# Patient Record
Sex: Female | Born: 1955 | Race: White | Hispanic: No | Smoking: Current every day smoker
Health system: Southern US, Community
[De-identification: ages and names within clinical notes are randomized; demographics above are authoritative.]

## PROBLEM LIST (undated history)

## (undated) DIAGNOSIS — K219 Gastro-esophageal reflux disease without esophagitis: Secondary | ICD-10-CM

## (undated) DIAGNOSIS — M1990 Unspecified osteoarthritis, unspecified site: Secondary | ICD-10-CM

---

## 2006-11-01 ENCOUNTER — Emergency Department: Payer: Self-pay | Admitting: Emergency Medicine

## 2014-12-02 HISTORY — PX: EYE SURGERY: SHX253

## 2020-03-20 LAB — HM COLONOSCOPY

## 2020-04-18 LAB — HM MAMMOGRAPHY

## 2021-05-17 ENCOUNTER — Ambulatory Visit
Admission: EM | Admit: 2021-05-17 | Discharge: 2021-05-17 | Disposition: A | Discharge status: Home / Self Care | Payer: Medicare Other | Attending: Internal Medicine | Admitting: Internal Medicine

## 2021-05-17 ENCOUNTER — Encounter: Payer: Self-pay | Admitting: Emergency Medicine

## 2021-05-17 ENCOUNTER — Other Ambulatory Visit: Payer: Self-pay

## 2021-05-17 DIAGNOSIS — R351 Nocturia: Secondary | ICD-10-CM | POA: Diagnosis not present

## 2021-05-17 DIAGNOSIS — R6889 Other general symptoms and signs: Secondary | ICD-10-CM | POA: Insufficient documentation

## 2021-05-17 HISTORY — DX: Unspecified osteoarthritis, unspecified site: M19.90

## 2021-05-17 LAB — POCT URINALYSIS DIP (MANUAL ENTRY)
Bilirubin, UA: NEGATIVE
Glucose, UA: NEGATIVE mg/dL
Ketones, POC UA: NEGATIVE mg/dL
Nitrite, UA: NEGATIVE
Protein Ur, POC: NEGATIVE mg/dL
Spec Grav, UA: 1.02 (ref 1.010–1.025)
Urobilinogen, UA: 0.2 E.U./dL
pH, UA: 6 (ref 5.0–8.0)

## 2021-05-17 MED ORDER — NITROFURANTOIN MONOHYD MACRO 100 MG PO CAPS: 100.0000 mg | ORAL_CAPSULE | Freq: Two times a day (BID) | ORAL | 0 refills | Status: DC

## 2021-05-17 MED ORDER — OSELTAMIVIR PHOSPHATE 75 MG PO CAPS: 75.0000 mg | ORAL_CAPSULE | Freq: Two times a day (BID) | ORAL | 0 refills | Status: DC

## 2021-05-17 NOTE — ED Provider Notes (Signed)
Renaldo Fiddler    CSN: [id number] Arrival date & time: 05/17/21  0949      History   Chief Complaint Chief Complaint  Patient presents with   Facial Pain   Fever   Dysuria    HPI MILICENT ACHEAMPONG is a 65 y.o. female presents today with body aches, fever and sinus pressure on L face x 2 days.  She declines Covid testing. Was very stuffy and today is feeling better. She felt she had a high fever, but did not have a thermometer. Has not been sick at all in the past 3 weeks.  2- Urinary frequency qhs and would like be checked for UTI. Denies dysuria.     Past Medical History:  Diagnosis Date   Arthritis     There are no problems to display for this patient.   Past Surgical History:  Procedure Laterality Date   APPENDECTOMY     TUBAL LIGATION      OB History   No obstetric history on file.      Home Medications    Prior to Admission medications   Medication Sig Start Date End Date Taking? Authorizing Provider  ibuprofen (ADVIL) 400 MG tablet Take 400 mg by mouth every 6 (six) hours as needed.   Yes [provider]  nitrofurantoin, macrocrystal-monohydrate, (MACROBID) 100 MG capsule Take 1 capsule (100 mg total) by mouth 2 (two) times daily. 05/17/21  Yes Rodriguez-Southworth, Nettie Elm, PA-C  oseltamivir (TAMIFLU) 75 MG capsule Take 1 capsule (75 mg total) by mouth every 12 (twelve) hours. 05/17/21  Yes Rodriguez-Southworth, Viviana Simpler    Family History History reviewed. No pertinent family history.  Social History Social History   Tobacco Use   Smoking status: Some Days    Pack years: 0.00    Types: Cigarettes   Smokeless tobacco: Never  Substance Use Topics   Alcohol use: Yes    Comment: rarely   Drug use: Never     Allergies   Patient has no known allergies.   Review of Systems + for frequency, body aches, nose congestion, fever and body aches.    Physical Exam Triage Vital Signs ED Triage Vitals  Enc Vitals Group      BP 05/17/21 1004 (!) 141/95     Pulse Rate 05/17/21 1004 (!) 114     Resp 05/17/21 1004 18     Temp 05/17/21 1004 99.3 F (37.4 C)     Temp Source 05/17/21 1004 Oral     SpO2 05/17/21 1004 95 %     Weight 05/17/21 1007 200 lb (90.7 kg)     Height 05/17/21 1007 5\' 9"  (1.753 m)     Head Circumference --      Peak Flow --      Pain Score 05/17/21 1007 2     Pain Loc --      Pain Edu? --      Excl. in GC? --    No data found.  Updated Vital Signs BP (!) 141/95 (BP Location: Left Arm)   Pulse (!) 114   Temp 99.3 F (37.4 C) (Oral)   Resp 18   Ht 5\' 9"  (1.753 m)   Wt 200 lb (90.7 kg)   SpO2 95%   BMI 29.53 kg/m   Visual Acuity Right Eye Distance:   Left Eye Distance:   Bilateral Distance:    Right Eye Near:   Left Eye Near:    Bilateral Near:     Physical  Exam Physical Exam Vitals signs and nursing note reviewed.  Constitutional:      General: She is not in acute distress.    Appearance: Normal appearance. She is not ill-appearing, toxic-appearing or diaphoretic.  HENT:     Head: Normocephalic.     Right Ear: Tympanic membrane, ear canal and external ear normal.     Left Ear: Tympanic membrane, ear canal and external ear normal.     Nose: mild congestion    Mouth/Throat: clear    Mouth: Mucous membranes are moist.  Eyes:     General: No scleral icterus.       Right eye: No discharge.        Left eye: No discharge.     Conjunctiva/sclera: Conjunctivae normal.  Neck:     Musculoskeletal: Neck supple. No neck rigidity.  Cardiovascular:     Rate and Rhythm: Normal rate and regular rhythm.     Heart sounds: No murmur.  Pulmonary:     Effort: Pulmonary effort is normal.     Breath sounds: Normal breath sounds.  Abdominal:     General: Bowel sounds are normal. There is no distension.     Palpations: Abdomen is soft. There is no mass.     Tenderness: There is no abdominal tenderness. There is no guarding or rebound.     Hernia: No hernia is present. Neg CVA  tenderness Musculoskeletal: Normal range of motion.  Lymphadenopathy:     Cervical: No cervical adenopathy.  Skin:    General: Skin is warm and dry.     Coloration: Skin is not jaundiced.     Findings: No rash.  Neurological:     Mental Status: She is alert and oriented to person, place, and time.     Gait: Gait normal.  Psychiatric:        Mood and Affect: Mood normal.        Behavior: Behavior normal.        Thought Content: Thought content normal.        Judgment: Judgment normal.    UC Treatments / Results  Labs (all labs ordered are listed, but only abnormal results are displayed) Labs Reviewed  POCT URINALYSIS DIP (MANUAL ENTRY) - Abnormal; Notable for the following components:      Result Value   Blood, UA trace-intact (*)    Leukocytes, UA Small (1+) (*)    All other components within normal limits  URINE CULTURE    EKG   Radiology No results found.  Procedures Procedures (including critical care time)  Medications Ordered in UC Medications - No data to display  Initial Impression / Assessment and Plan / UC Course  I have reviewed the triage vital signs and the nursing notes. Pertinent labs  results that were available during my care of the patient were reviewed by me and considered in my medical decision making (see chart for details). Has flu like symptoms, may have covid but she declined testing for both. Did request antiviral so I prescribed her Tamiflu as noted.  May have early UTI and I also placed her on Macrobid. Urine culture was ordered. We will call her if we need to change her rx.  Final Clinical Impressions(s) / UC Diagnoses   Final diagnoses:  Nocturia  Flu-like symptoms     Discharge Instructions      Your urine results dont justify the fever you have been having. You may have Covid or flu. We will call you if the urine  culture shows we need to change your antibiotic. Continue the Ibuprofen for body aches, fever. You may do saline  rinses to flush out your sinuses and do Netie Pot rinses twice a day for 5-7 days  If your Covid test at home ends up positive( do it day 3-5 of onset of symptoms) you may take the following supplements to help your immune system be stronger to fight this viral infection Take Quarcetin 500 mg three times a day x 7 days with Zinc 50 mg ones a day x 7 days. The quarcetin is an antiviral and anti-inflammatory supplement which helps open the zinc channels in the cell to absorb Zinc. Zinc helps decrease the virus load in your body. Take Melatonin 6-10 mg at bed time which also helps support your immune system.  Also make sure to take Vit D 5,000 IU per day with a fatty meal and Vit C 5000 mg a day until you are completely better. To prevent viral illnesses your vitamin D should be between 60-80. Stay on Vitamin D 2,000  and C  1000 mg the rest of the season.  Don't lay around, keep active and walk as much as you are able to to prevent worsening of your symptoms.  Follow up with your family Dr next week.  If you get short of breath and you are able to check  your oxygen with a pulse oxygen meter, if it gets to 92% or less, you need to go to the hospital to be admitted. If you dont have one, come back here and we will assess you.       ED Prescriptions     Medication Sig Dispense Auth. Provider   oseltamivir (TAMIFLU) 75 MG capsule Take 1 capsule (75 mg total) by mouth every 12 (twelve) hours. 10 capsule Rodriguez-Southworth, Nettie Elm, PA-C   nitrofurantoin, macrocrystal-monohydrate, (MACROBID) 100 MG capsule Take 1 capsule (100 mg total) by mouth 2 (two) times daily. 10 capsule Rodriguez-Southworth, Nettie Elm, PA-C      PDMP not reviewed this encounter.   Garey Ham, PA-C 05/17/21 1343

## 2021-05-17 NOTE — Discharge Instructions (Addendum)
Your urine results dont justify the fever you have been having. You may have Covid or flu. We will call you if the urine culture shows we need to change your antibiotic. Continue the Ibuprofen for body aches, fever. You may do saline rinses to flush out your sinuses and do Netie Pot rinses twice a day for 5-7 days  If your Covid test at home ends up positive( do it day 3-5 of onset of symptoms) you may take the following supplements to help your immune system be stronger to fight this viral infection Take Quarcetin 500 mg three times a day x 7 days with Zinc 50 mg ones a day x 7 days. The quarcetin is an antiviral and anti-inflammatory supplement which helps open the zinc channels in the cell to absorb Zinc. Zinc helps decrease the virus load in your body. Take Melatonin 6-10 mg at bed time which also helps support your immune system.  Also make sure to take Vit D 5,000 IU per day with a fatty meal and Vit C 5000 mg a day until you are completely better. To prevent viral illnesses your vitamin D should be between 60-80. Stay on Vitamin D 2,000  and C  1000 mg the rest of the season.  Don't lay around, keep active and walk as much as you are able to to prevent worsening of your symptoms.  Follow up with your family Dr next week.  If you get short of breath and you are able to check  your oxygen with a pulse oxygen meter, if it gets to 92% or less, you need to go to the hospital to be admitted. If you dont have one, come back here and we will assess you.

## 2021-05-17 NOTE — ED Triage Notes (Addendum)
Pt presents today with c/o of body aches, fever and sinus pressure x 2 days. She also reports urine frequency during the night. She would like to also be tested for UTI. Denies n/v/d. Pt declines to be test for Covid.

## 2021-05-18 LAB — URINE CULTURE: Culture: NO GROWTH

## 2021-06-29 ENCOUNTER — Encounter: Payer: Self-pay | Admitting: Family Medicine

## 2021-06-29 ENCOUNTER — Other Ambulatory Visit: Payer: Self-pay | Admitting: Family Medicine

## 2021-06-29 ENCOUNTER — Other Ambulatory Visit: Payer: Self-pay

## 2021-06-29 ENCOUNTER — Ambulatory Visit (INDEPENDENT_AMBULATORY_CARE_PROVIDER_SITE_OTHER): Payer: Medicare Other | Admitting: Family Medicine

## 2021-06-29 VITALS — BP 145/88 | HR 87 | Ht 69.0 in | Wt 209.0 lb

## 2021-06-29 DIAGNOSIS — M159 Polyosteoarthritis, unspecified: Secondary | ICD-10-CM

## 2021-06-29 DIAGNOSIS — M545 Low back pain, unspecified: Secondary | ICD-10-CM | POA: Diagnosis not present

## 2021-06-29 DIAGNOSIS — M8949 Other hypertrophic osteoarthropathy, multiple sites: Secondary | ICD-10-CM

## 2021-06-29 DIAGNOSIS — R7309 Other abnormal glucose: Secondary | ICD-10-CM

## 2021-06-29 DIAGNOSIS — E538 Deficiency of other specified B group vitamins: Secondary | ICD-10-CM

## 2021-06-29 DIAGNOSIS — M150 Primary generalized (osteo)arthritis: Secondary | ICD-10-CM

## 2021-06-29 DIAGNOSIS — I10 Essential (primary) hypertension: Secondary | ICD-10-CM

## 2021-06-29 DIAGNOSIS — Z7689 Persons encountering health services in other specified circumstances: Secondary | ICD-10-CM | POA: Diagnosis not present

## 2021-06-29 DIAGNOSIS — M25552 Pain in left hip: Secondary | ICD-10-CM

## 2021-06-29 DIAGNOSIS — G8929 Other chronic pain: Secondary | ICD-10-CM

## 2021-06-29 DIAGNOSIS — E7889 Other lipoprotein metabolism disorders: Secondary | ICD-10-CM

## 2021-06-29 MED ORDER — BACLOFEN 10 MG PO TABS: 5.0000 mg | ORAL_TABLET | Freq: Three times a day (TID) | ORAL | 2 refills | Status: DC | PRN

## 2021-06-29 NOTE — Patient Instructions (Addendum)
Thank you for coming to the office today.  Recommend to start taking Tylenol Extra Strength 500mg  tabs - take 1 to 2 tabs per dose (max 1000mg ) every 6-8 hours for pain, max 24 hour daily dose is 6 tablets or 3000mg . In the future you can repeat the same everyday Tylenol course for 1-2 weeks at a time.   START anti inflammatory topical - OTC Voltaren (generic Diclofenac) topical 2-4 times a day as needed for pain swelling of affected joint for 1-2 weeks or longer.  Start taking Baclofen (Lioresal) 10mg  (muscle relaxant) - start with half (cut) to one whole pill at night as needed for next 1-3 nights (may make you drowsy, caution with driving) see how it affects you, then if tolerated increase to one pill 2 to 3 times a day or (every 8 hours as needed)  Future X-rays in the system, first come first serve, walk in Mon-Thurs, not during 12-1pm.   Please schedule a Follow-up Appointment to: Return in about 4 weeks (around 07/27/2021) for 4 weeks fasting lab only then 1 week later follow-up Arthritis / Labs / Vitamin B.  If you have any other questions or concerns, please feel free to call the office or send a message through MyChart. You may also schedule an earlier appointment if necessary.  Additionally, you may be receiving a survey about your experience at our office within a few days to 1 week by e-mail or mail. We value your feedback.  , DO [facility], 

## 2021-06-29 NOTE — Progress Notes (Signed)
Subjective:    Patient ID: Courtney Cervantes, female    DOB: October 15, 1956, 65 y.o.   MRN: [id number]  Courtney Cervantes is a 65 y.o. female presenting on 06/29/2021 for Establish Care and Hip Pain  Establish care, relocated to [place] years ago. Previous Dr Reva Bores  HPI  Osteoarthritis multiple joints Bilateral Hips, Low Back Pain Chronic L Hip Pain She has episodic flares with hip pain, worse with pain and stiffness, worse after prolonged sitting Impacting her daily function, she is self aware of this and impacting her in multiple ways. She is not walking regularly anymore. Admits gained some more weight as a result.  Tried Meloxicam without success. Tried intermittent Tylenol, Aleve, Motrin. Had X-rays but unsure when they were, done prior in [place] She has done Physical Therapy in past.  Question IT Band symptoms. She has a lot of stiffness, difficulty moving hip   History of B12 Deficiency - has resolved. Requesting repeat lab in future    Health Maintenance:  Colonoscopy done in [place], no polyps, diverticulosis.  Depression screen [facility] 2/9 06/29/2021  Decreased Interest 0  Down, Depressed, Hopeless 0  PHQ - 2 Score 0  Altered sleeping 3  Tired, decreased energy 0  Change in appetite 0  Feeling bad or failure about yourself  0  Trouble concentrating 0  Moving slowly or fidgety/restless 0  Suicidal thoughts 0  PHQ-9 Score 3  Difficult doing work/chores Very difficult    Past Medical History:  Diagnosis Date   Arthritis    Past Surgical History:  Procedure Laterality Date   APPENDECTOMY     TUBAL LIGATION     Social History   Socioeconomic History   Marital status: Widowed    Spouse name: Not on file   Number of children: Not on file   Years of education: Not on file   Highest education level: Not on file  Occupational History   Not on file  Tobacco Use   Smoking status: Every Day    Types: Cigarettes   Smokeless tobacco: Never   Substance and Sexual Activity   Alcohol use: Not Currently    Comment: rarely   Drug use: Never   Sexual activity: Not on file  Other Topics Concern   Not on file  Social History Narrative   Not on file   Social Determinants of Health   Financial Resource Strain: Not on file  Food Insecurity: Not on file  Transportation Needs: Not on file  Physical Activity: Not on file  Stress: Not on file  Social Connections: Not on file  Intimate Partner Violence: Not on file   History reviewed. No pertinent family history. Current Outpatient Medications on File Prior to Visit  Medication Sig   ibuprofen (ADVIL) 400 MG tablet Take 400 mg by mouth every 6 (six) hours as needed.   No current facility-administered medications on file prior to visit.    Review of Systems Per HPI unless specifically indicated above      Objective:    BP (!) 145/88   Pulse 87   Ht 5\' 9"  (1.753 m)   Wt 209 lb (94.8 kg)   SpO2 98%   BMI 30.86 kg/m   Wt Readings from Last 3 Encounters:  06/29/21 209 lb (94.8 kg)  05/17/21 200 lb (90.7 kg)    Physical Exam Vitals and nursing note reviewed.  Constitutional:      General: She is not in acute distress.    Appearance:  Normal appearance. She is well-developed. She is not diaphoretic.     Comments: Well-appearing, comfortable, cooperative  HENT:     Head: Normocephalic and atraumatic.  Eyes:     General:        Right eye: No discharge.        Left eye: No discharge.     Conjunctiva/sclera: Conjunctivae normal.  Cardiovascular:     Rate and Rhythm: Normal rate.  Pulmonary:     Effort: Pulmonary effort is normal.  Musculoskeletal:     Comments: Low Back / L hip and Greater Trochanter Inspection: BACK - Normal appearance, no spinal deformity, symmetrical. HIP - Normal appearance, symmetrical, no obvious leg length or pelvis deformity  Palpation: BACK - No tenderness over spinous processes. Bilateral lumbar paraspinal muscles non-tender and  without hypertonicity/spasm. Some mild discomfort over R iliac crest region of hip. HIP - Mild tender to palpation deeper L greater trochanter region of lateral upper thigh. Lower extremity thigh calf soft non tender no spasm.  ROM: BACK - Full active ROM forward flex / back extension, rotation L/R without discomfort HIP - LIMITED L hip range of motion with extension, has intact flexion from seated or supine. Reduced ROM internal and ext rot  Special Testing: BACK - Seated SLR negative for radicular pain bilaterally HIP - tolerates FABER FADIR but has reduced ROM and stiffness.  Strength: Bilateral hip flex/ext 5/5, knee flex/ext 5/5, ankle dorsiflex/plantarflex 5/5 Neurovascular: intact distal sensation to light touch   Skin:    General: Skin is warm and dry.     Findings: No erythema or rash.  Neurological:     Mental Status: She is alert and oriented to person, place, and time.  Psychiatric:        Mood and Affect: Mood normal.        Behavior: Behavior normal.        Thought Content: Thought content normal.     Comments: Well groomed, good eye contact, normal speech and thoughts   Results for orders placed or performed during the hospital encounter of 05/17/21  Urine Culture   Specimen: Urine, Random  Result Value Ref Range   Specimen Description URINE, RANDOM    Special Requests NONE    Culture      NO GROWTH Performed at [facility], [address], [place], [place] [address]    Report Status 05/18/2021 FINAL   POCT urinalysis dipstick  Result Value Ref Range   Color, UA yellow yellow   Clarity, UA clear clear   Glucose, UA negative negative mg/dL   Bilirubin, UA negative negative   Ketones, POC UA negative negative mg/dL   Spec Grav, UA 3.428 7.681 - 1.025   Blood, UA trace-intact (A) negative   pH, UA 6.0 5.0 - 8.0   Protein Ur, POC negative negative mg/dL   Urobilinogen, UA 0.2 0.2 or 1.0 E.U./dL   Nitrite, UA Negative Negative   Leukocytes, UA Small  (1+) (A) Negative      Assessment & Plan:   Problem List Items Addressed This Visit     Primary osteoarthritis involving multiple joints - Primary   Relevant Medications   baclofen (LIORESAL) 10 MG tablet   Other Relevant Orders   DG HIP UNILAT W OR W/O PELVIS 2-3 VIEWS LEFT   DG Lumbar Spine Complete   Other Visit Diagnoses     Encounter to establish care with new doctor       Chronic left hip pain  Relevant Medications   baclofen (LIORESAL) 10 MG tablet   Other Relevant Orders   DG HIP UNILAT W OR W/O PELVIS 2-3 VIEWS LEFT   Chronic bilateral low back pain without sciatica       Relevant Medications   baclofen (LIORESAL) 10 MG tablet   Other Relevant Orders   DG Lumbar Spine Complete       Chronic gradual worsening problem with L Hip pain Also Chronic Low Back Pain 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 Limiting her function overall, mobility and other activities. - No radiation of pain or radicular symptoms - Inadequate conservative therapy   Plan: 1. Start with high dose Tylenol 1000mg  TID 2. Start muscle relaxant with Baclofen 10mg  tabs - take 5-10mg  up to BID PRN, titrate up as tolerated 3. Topical OTC Voltaren - Has tried oral NSAID but she declines these now, failed Meloxicam in past. 4. Encouraged use of heating pad 1-2x daily for now then PRN. 5. X-rays Hip and Lumbar spine in 1 week, review and further plan to refer to Sports Med Dr if persistent issue, she wishes to avoid surgery - and is interested in other management, may benefit from therapy regimen and MSK if indicated for IT Band.   Orders Placed This Encounter  Procedures   DG HIP UNILAT W OR W/O PELVIS 2-3 VIEWS LEFT    Standing Status:   Future    Standing Expiration Date:   06/29/2022    Order Specific Question:   Reason for Exam (SYMPTOM  OR DIAGNOSIS  REQUIRED)    Answer:   chronic Left hip pain, history of OA/DJD and question IT Band    Order Specific Question:   Preferred imaging location?    Answer:   [facility] [place]    Order Specific Question:   Radiology Contrast Protocol - do NOT remove file path    Answer:   [email or web address]   DG Lumbar Spine Complete    Standing Status:   Future    Standing Expiration Date:   06/29/2022    Order Specific Question:   Reason for Exam (SYMPTOM  OR DIAGNOSIS REQUIRED)    Answer:   chronic low back pain, history of OA/DJD, also L hip pain    Order Specific Question:   Preferred imaging location?    Answer:   [facility] [place]      Meds ordered this encounter  Medications   baclofen (LIORESAL) 10 MG tablet    Sig: Take 0.5-1 tablets (5-10 mg total) by mouth 3 (three) times daily as needed for muscle spasms.    Dispense:  30 each    Refill:  2      Follow up plan: Return in about 4 weeks (around 07/27/2021) for 4 weeks fasting lab only then 1 week later follow-up Arthritis / Labs / Vitamin B.  Lab orders Future labs 07/19/21 -  CMET, Lipid, A1c, CBC, Vitamin B12  07/29/2021, DO [facility] 06/29/2021, 3:21 PM

## 2021-07-02 ENCOUNTER — Ambulatory Visit
Admission: RE | Admit: 2021-07-02 | Discharge: 2021-07-02 | Disposition: A | Discharge status: Home / Self Care | Payer: Medicare Other | Attending: Family Medicine | Admitting: Family Medicine

## 2021-07-02 ENCOUNTER — Ambulatory Visit
Admission: RE | Admit: 2021-07-02 | Discharge: 2021-07-02 | Disposition: A | Discharge status: Home / Self Care | Payer: Medicare Other | Source: Ambulatory Visit | Attending: Family Medicine | Admitting: Family Medicine

## 2021-07-02 ENCOUNTER — Other Ambulatory Visit: Payer: Self-pay

## 2021-07-02 DIAGNOSIS — M159 Polyosteoarthritis, unspecified: Secondary | ICD-10-CM

## 2021-07-02 DIAGNOSIS — M545 Low back pain, unspecified: Secondary | ICD-10-CM | POA: Diagnosis present

## 2021-07-02 DIAGNOSIS — M25552 Pain in left hip: Secondary | ICD-10-CM | POA: Insufficient documentation

## 2021-07-02 DIAGNOSIS — M8949 Other hypertrophic osteoarthropathy, multiple sites: Secondary | ICD-10-CM

## 2021-07-02 DIAGNOSIS — G8929 Other chronic pain: Secondary | ICD-10-CM | POA: Diagnosis present

## 2021-07-12 ENCOUNTER — Encounter: Payer: Self-pay | Admitting: Family Medicine

## 2021-07-18 ENCOUNTER — Other Ambulatory Visit: Payer: Self-pay

## 2021-07-18 DIAGNOSIS — R7309 Other abnormal glucose: Secondary | ICD-10-CM

## 2021-07-18 DIAGNOSIS — E7889 Other lipoprotein metabolism disorders: Secondary | ICD-10-CM

## 2021-07-18 DIAGNOSIS — M8949 Other hypertrophic osteoarthropathy, multiple sites: Secondary | ICD-10-CM

## 2021-07-18 DIAGNOSIS — E538 Deficiency of other specified B group vitamins: Secondary | ICD-10-CM

## 2021-07-18 DIAGNOSIS — I10 Essential (primary) hypertension: Secondary | ICD-10-CM

## 2021-07-18 DIAGNOSIS — M159 Polyosteoarthritis, unspecified: Secondary | ICD-10-CM

## 2021-07-19 ENCOUNTER — Other Ambulatory Visit: Payer: Self-pay

## 2021-07-19 ENCOUNTER — Other Ambulatory Visit: Payer: Medicare Other

## 2021-07-20 LAB — CBC WITH DIFFERENTIAL/PLATELET
Absolute Monocytes: 403 cells/uL (ref 200–950)
Basophils Absolute: 50 cells/uL (ref 0–200)
Basophils Relative: 0.8 %
Eosinophils Absolute: 341 cells/uL (ref 15–500)
Eosinophils Relative: 5.5 %
HCT: 47.7 % — ABNORMAL HIGH (ref 35.0–45.0)
Hemoglobin: 16.2 g/dL — ABNORMAL HIGH (ref 11.7–15.5)
Lymphs Abs: 1600 cells/uL (ref 850–3900)
MCH: 32.2 pg (ref 27.0–33.0)
MCHC: 34 g/dL (ref 32.0–36.0)
MCV: 94.8 fL (ref 80.0–100.0)
MPV: 9.1 fL (ref 7.5–12.5)
Monocytes Relative: 6.5 %
Neutro Abs: 3807 cells/uL (ref 1500–7800)
Neutrophils Relative %: 61.4 %
Platelets: 236 10*3/uL (ref 140–400)
RBC: 5.03 10*6/uL (ref 3.80–5.10)
RDW: 12.9 % (ref 11.0–15.0)
Total Lymphocyte: 25.8 %
WBC: 6.2 10*3/uL (ref 3.8–10.8)

## 2021-07-20 LAB — LIPID PANEL
Cholesterol: 260 mg/dL — ABNORMAL HIGH (ref <200)
HDL: 40 mg/dL — ABNORMAL LOW (ref >=50)
LDL Cholesterol (Calc): 158 mg/dL (calc) — ABNORMAL HIGH
Non-HDL Cholesterol (Calc): 220 mg/dL (calc) — ABNORMAL HIGH (ref <130)
Total CHOL/HDL Ratio: 6.5 (calc) — ABNORMAL HIGH (ref <5.0)
Triglycerides: 367 mg/dL — ABNORMAL HIGH (ref <150)

## 2021-07-20 LAB — COMPLETE METABOLIC PANEL WITH GFR
AG Ratio: 1.9 (calc) (ref 1.0–2.5)
ALT: 34 U/L — ABNORMAL HIGH (ref 6–29)
AST: 27 U/L (ref 10–35)
Albumin: 4.2 g/dL (ref 3.6–5.1)
Alkaline phosphatase (APISO): 65 U/L (ref 37–153)
BUN: 14 mg/dL (ref 7–25)
CO2: 29 mmol/L (ref 20–32)
Calcium: 9.4 mg/dL (ref 8.6–10.4)
Chloride: 105 mmol/L (ref 98–110)
Creat: 0.88 mg/dL (ref 0.50–1.05)
Globulin: 2.2 g/dL (calc) (ref 1.9–3.7)
Glucose, Bld: 93 mg/dL (ref 65–139)
Potassium: 4.4 mmol/L (ref 3.5–5.3)
Sodium: 141 mmol/L (ref 135–146)
Total Bilirubin: 0.7 mg/dL (ref 0.2–1.2)
Total Protein: 6.4 g/dL (ref 6.1–8.1)
eGFR: 72 mL/min/{1.73_m2} (ref >=60)

## 2021-07-20 LAB — HEMOGLOBIN A1C
Hgb A1c MFr Bld: 4.8 % of total Hgb (ref <5.7)
Mean Plasma Glucose: 91 mg/dL
eAG (mmol/L): 5 mmol/L

## 2021-07-20 LAB — TSH: TSH: 4.26 mIU/L (ref 0.40–4.50)

## 2021-07-20 LAB — VITAMIN B12: Vitamin B-12: 220 pg/mL (ref 200–1100)

## 2021-07-26 ENCOUNTER — Ambulatory Visit (INDEPENDENT_AMBULATORY_CARE_PROVIDER_SITE_OTHER): Payer: Medicare Other | Admitting: Family Medicine

## 2021-07-26 ENCOUNTER — Encounter: Payer: Self-pay | Admitting: Family Medicine

## 2021-07-26 ENCOUNTER — Other Ambulatory Visit: Payer: Self-pay

## 2021-07-26 VITALS — BP 136/84 | HR 83 | Ht 69.0 in | Wt 211.2 lb

## 2021-07-26 DIAGNOSIS — E782 Mixed hyperlipidemia: Secondary | ICD-10-CM | POA: Diagnosis not present

## 2021-07-26 DIAGNOSIS — M8949 Other hypertrophic osteoarthropathy, multiple sites: Secondary | ICD-10-CM

## 2021-07-26 DIAGNOSIS — M150 Primary generalized (osteo)arthritis: Secondary | ICD-10-CM

## 2021-07-26 DIAGNOSIS — M159 Polyosteoarthritis, unspecified: Secondary | ICD-10-CM

## 2021-07-26 DIAGNOSIS — G8929 Other chronic pain: Secondary | ICD-10-CM

## 2021-07-26 DIAGNOSIS — I10 Essential (primary) hypertension: Secondary | ICD-10-CM | POA: Diagnosis not present

## 2021-07-26 DIAGNOSIS — M25552 Pain in left hip: Secondary | ICD-10-CM

## 2021-07-26 DIAGNOSIS — M545 Low back pain, unspecified: Secondary | ICD-10-CM

## 2021-07-26 NOTE — Progress Notes (Signed)
Subjective:    Patient ID: Courtney Cervantes, female    DOB: [birth year], 65 y.o.   MRN: [id number]  Courtney Cervantes is a 65 y.o. female presenting on 07/26/2021 for Arthritis   HPI  Left Hip Pain, Chronic  Osteoarthritis multiple joints Bilateral Hips, Low Back Pain She has episodic flares with hip pain, worse with pain and stiffness, worse after prolonged sitting Impacting her daily function, she is self aware of this and impacting her in multiple ways. She is not walking regularly anymore. Admits gained some more weight as a result.  Last visit 06/29/21 for this issue. Trial on Baclofen.   Tried Meloxicam without success. Tried intermittent Tylenol, Aleve, Motrin.  Reviewed last X-rays done recently 07/2021  She has done Physical Therapy in past.   Question IT Band symptoms. She has a lot of stiffness, difficulty moving hip   History of B12 Deficiency Last lab B12 220 result. She takes a Vitamin B Complex.  HYPERLIPIDEMIA: - Reports concerns. Last lipid panel 07/2021, abnormal  Tobacco Abuse Elevated Hemoglobin Recent stressors, she has been smoking regularly   Depression screen [facility] 2/9 06/29/2021  Decreased Interest 0  Down, Depressed, Hopeless 0  PHQ - 2 Score 0  Altered sleeping 3  Tired, decreased energy 0  Change in appetite 0  Feeling bad or failure about yourself  0  Trouble concentrating 0  Moving slowly or fidgety/restless 0  Suicidal thoughts 0  PHQ-9 Score 3  Difficult doing work/chores Very difficult    Social History   Tobacco Use   Smoking status: Every Day    Types: Cigarettes   Smokeless tobacco: Never  Substance Use Topics   Alcohol use: Not Currently    Comment: rarely   Drug use: Never    Review of Systems Per HPI unless specifically indicated above     Objective:    BP 136/84   Pulse 83   Ht '5\' 9"'  (1.753 m)   Wt 211 lb 3.2 oz (95.8 kg)   SpO2 98%   BMI 31.19 kg/m   Wt Readings from Last 3 Encounters:  07/26/21  211 lb 3.2 oz (95.8 kg)  06/29/21 209 lb (94.8 kg)  05/17/21 200 lb (90.7 kg)    Physical Exam Vitals and nursing note reviewed.  Constitutional:      General: She is not in acute distress.    Appearance: Normal appearance. She is well-developed. She is not diaphoretic.     Comments: Well-appearing, comfortable, cooperative  HENT:     Head: Normocephalic and atraumatic.  Eyes:     General:        Right eye: No discharge.        Left eye: No discharge.     Conjunctiva/sclera: Conjunctivae normal.  Cardiovascular:     Rate and Rhythm: Normal rate.  Pulmonary:     Effort: Pulmonary effort is normal.  Musculoskeletal:     Comments: Low Back / L hip and Greater Trochanter Inspection: BACK - Normal appearance, no spinal deformity, symmetrical. HIP - Normal appearance, symmetrical, no obvious leg length or pelvis deformity  Palpation: BACK - No tenderness over spinous processes. Bilateral lumbar paraspinal muscles non-tender and without hypertonicity/spasm. Some mild discomfort over R iliac crest region of hip. HIP - Mild tender to palpation deeper L greater trochanter region of lateral upper thigh. Lower extremity thigh calf soft non tender no spasm.  ROM: BACK - Full active ROM forward flex / back extension, rotation L/R without discomfort HIP -  LIMITED L hip range of motion with extension, has intact flexion from seated or supine. Reduced ROM internal and ext rot  Special Testing: BACK - Seated SLR negative for radicular pain bilaterally HIP - tolerates FABER FADIR but has reduced ROM and stiffness.  Strength: Bilateral hip flex/ext 5/5, knee flex/ext 5/5, ankle dorsiflex/plantarflex 5/5 Neurovascular: intact distal sensation to light touch   Skin:    General: Skin is warm and dry.     Findings: No erythema or rash.  Neurological:     Mental Status: She is alert and oriented to person, place, and time.  Psychiatric:        Mood and Affect: Mood normal.        Behavior:  Behavior normal.        Thought Content: Thought content normal.     Comments: Well groomed, good eye contact, normal speech and thoughts     Results for orders placed or performed in visit on 07/18/21  TSH  Result Value Ref Range   TSH 4.26 0.40 - 4.50 mIU/L  Vitamin B12  Result Value Ref Range   Vitamin B-12 220 200 - 1,100 pg/mL  Hemoglobin A1c  Result Value Ref Range   Hgb A1c MFr Bld 4.8 <5.7 % of total Hgb   Mean Plasma Glucose 91 mg/dL   eAG (mmol/L) 5.0 mmol/L  CBC with Differential/Platelet  Result Value Ref Range   WBC 6.2 3.8 - 10.8 Thousand/uL   RBC 5.03 3.80 - 5.10 Million/uL   Hemoglobin 16.2 (H) 11.7 - 15.5 g/dL   HCT 47.7 (H) 35.0 - 45.0 %   MCV 94.8 80.0 - 100.0 fL   MCH 32.2 27.0 - 33.0 pg   MCHC 34.0 32.0 - 36.0 g/dL   RDW 12.9 11.0 - 15.0 %   Platelets 236 140 - 400 Thousand/uL   MPV 9.1 7.5 - 12.5 fL   Neutro Abs 3,807 1,500 - 7,800 cells/uL   Lymphs Abs 1,600 850 - 3,900 cells/uL   Absolute Monocytes 403 200 - 950 cells/uL   Eosinophils Absolute 341 15 - 500 cells/uL   Basophils Absolute 50 0 - 200 cells/uL   Neutrophils Relative % 61.4 %   Total Lymphocyte 25.8 %   Monocytes Relative 6.5 %   Eosinophils Relative 5.5 %   Basophils Relative 0.8 %  Lipid panel  Result Value Ref Range   Cholesterol 260 (H) <200 mg/dL   HDL 40 (L) > OR = 50 mg/dL   Triglycerides 367 (H) <150 mg/dL   LDL Cholesterol (Calc) 158 (H) mg/dL (calc)   Total CHOL/HDL Ratio 6.5 (H) <5.0 (calc)   Non-HDL Cholesterol (Calc) 220 (H) <130 mg/dL (calc)  COMPLETE METABOLIC PANEL WITH GFR  Result Value Ref Range   Glucose, Bld 93 65 - 139 mg/dL   BUN 14 7 - 25 mg/dL   Creat 0.88 0.50 - 1.05 mg/dL   eGFR 72 > OR = 60 mL/min/1.32m   BUN/Creatinine Ratio NOT APPLICABLE 6 - 22 (calc)   Sodium 141 135 - 146 mmol/L   Potassium 4.4 3.5 - 5.3 mmol/L   Chloride 105 98 - 110 mmol/L   CO2 29 20 - 32 mmol/L   Calcium 9.4 8.6 - 10.4 mg/dL   Total Protein 6.4 6.1 - 8.1 g/dL   Albumin  4.2 3.6 - 5.1 g/dL   Globulin 2.2 1.9 - 3.7 g/dL (calc)   AG Ratio 1.9 1.0 - 2.5 (calc)   Total Bilirubin 0.7 0.2 - 1.2 mg/dL  Alkaline phosphatase (APISO) 65 37 - 153 U/L   AST 27 10 - 35 U/L   ALT 34 (H) 6 - 29 U/L   I have personally reviewed the radiology report from 07/02/21 on X-ray.  CLINICAL DATA:  Chronic low back and hip pain   EXAM: DG HIP (WITH OR WITHOUT PELVIS) 2-3V LEFT; LUMBAR SPINE - COMPLETE 4+ VIEW   COMPARISON:  X-ray lumbar spine dated November 01, 2006   FINDINGS: Lumbar spine: Five non-rib-bearing lumbar type vertebral bodies. Vertebral body heights are well maintained. No evidence of acute fracture. Normal alignment. Multilevel degenerative disc disease consisting of mild osteophyte formation and preserved disc space heights. Mild facet arthrosis, most pronounced in the lower lumbar spine. Soft tissues are unremarkable.   Left hip: No acute fracture or dislocation. Moderate degenerative changes of the left hip consisting of osteophyte formation, joint space loss, and subchondral sclerosis. Bilateral SI joints and pubic symphysis are unremarkable. Visualized sacrum is intact. Single AP view of the right hip demonstrates moderate degenerative changes and no acute osseous abnormality.   IMPRESSION: Mild spondylosis of the lumbar spine, increased compared to 2007 prior.   Moderate degenerative changes of the left hip.     Electronically Signed   By: Yetta Glassman MD   On: 07/03/2021 09:13  -----   CLINICAL DATA:  Chronic low back and hip pain   EXAM: DG HIP (WITH OR WITHOUT PELVIS) 2-3V LEFT; LUMBAR SPINE - COMPLETE 4+ VIEW   COMPARISON:  X-ray lumbar spine dated November 01, 2006   FINDINGS: Lumbar spine: Five non-rib-bearing lumbar type vertebral bodies. Vertebral body heights are well maintained. No evidence of acute fracture. Normal alignment. Multilevel degenerative disc disease consisting of mild osteophyte formation and preserved  disc space heights. Mild facet arthrosis, most pronounced in the lower lumbar spine. Soft tissues are unremarkable.   Left hip: No acute fracture or dislocation. Moderate degenerative changes of the left hip consisting of osteophyte formation, joint space loss, and subchondral sclerosis. Bilateral SI joints and pubic symphysis are unremarkable. Visualized sacrum is intact. Single AP view of the right hip demonstrates moderate degenerative changes and no acute osseous abnormality.   IMPRESSION: Mild spondylosis of the lumbar spine, increased compared to 2007 prior.   Moderate degenerative changes of the left hip.     Electronically Signed   By: Yetta Glassman MD   On: 07/03/2021 09:13     Assessment & Plan:   Problem List Items Addressed This Visit     Primary osteoarthritis involving multiple joints   Relevant Orders   Ambulatory referral to Sports Medicine   Mixed hyperlipidemia - Primary   Other Visit Diagnoses     Essential hypertension       Chronic left hip pain       Relevant Orders   Ambulatory referral to Sports Medicine   Chronic bilateral low back pain without sciatica       Relevant Orders   Ambulatory referral to Sports Medicine       Left Hip Chronic Pain Also assoc low back pain Osteoarthritis  Her symptoms are most likely a combination of factors, arthritis can certainly cause her symptoms and she very well may have more muscular and tendon issues.  Reviewed X-rays above Failed most conservative therapy options  chronic worsening L hip and low back pain, impacting function and mobility, no known injury  Now will proceed w/ referral to Sports Med for further evaluation for muscle/MSK etiology IT Band concern and  she prefers non surgical non orthopedic option.     Lab results  Check B12 amount, continue daily   Orders Placed This Encounter  Procedures   Ambulatory referral to Sports Medicine    Referral Priority:   Routine    Referral  Type:   Consultation    Number of Visits Requested:   1     No orders of the defined types were placed in this encounter.     Follow up plan: Return in about 6 months (around 01/26/2022) for 6 month follow-up Arthritis, Cholesterol AM fasting lab AFTER (if needed).   Nobie Putnam, [facility] 07/26/2021, 8:38 AM

## 2021-07-26 NOTE — Patient Instructions (Addendum)
Thank you for coming to the office today.  Red Rice Yeast supplement for cholesterol.  Lipid Panel     Component Value Date/Time   CHOL 260 (H) 07/19/2021 0814   TRIG 367 (H) 07/19/2021 0814   HDL 40 (L) 07/19/2021 0814   CHOLHDL 6.5 (H) 07/19/2021 0814   LDLCALC 158 (H) 07/19/2021 2130   Check your Vitamin for B12 - goal is 1000 mcg once daily.  Referral to Sports Med.  Joseph Berkshire, MD Address: [address], [place], [place] [address] Phone: [phone]  Recommend to start taking Tylenol Extra Strength 500mg  tabs - take 1 to 2 tabs per dose (max 1000mg ) every 6-8 hours for pain (take regularly, don't skip a dose for next 7 days), max 24 hour daily dose is 6 tablets or 3000mg . In the future you can repeat the same everyday Tylenol course for 1-2 weeks at a time.    Please schedule a Follow-up Appointment to: Return in about 6 months (around 01/26/2022) for 6 month follow-up Arthritis, Cholesterol AM fasting lab AFTER (if needed).  If you have any other questions or concerns, please feel free to call the office or send a message through MyChart. You may also schedule an earlier appointment if necessary.  Additionally, you may be receiving a survey about your experience at our office within a few days to 1 week by e-mail or mail. We value your feedback.  , DO [facility], 01/28/2022

## 2021-08-13 ENCOUNTER — Other Ambulatory Visit: Payer: Self-pay

## 2021-08-13 ENCOUNTER — Encounter: Payer: Self-pay | Admitting: Family Medicine

## 2021-08-13 ENCOUNTER — Ambulatory Visit (INDEPENDENT_AMBULATORY_CARE_PROVIDER_SITE_OTHER): Payer: Medicare Other | Admitting: Family Medicine

## 2021-08-13 VITALS — BP 122/72 | HR 87 | Temp 98.4°F | Ht 69.0 in | Wt 208.0 lb

## 2021-08-13 DIAGNOSIS — M1612 Unilateral primary osteoarthritis, left hip: Secondary | ICD-10-CM

## 2021-08-13 DIAGNOSIS — M47817 Spondylosis without myelopathy or radiculopathy, lumbosacral region: Secondary | ICD-10-CM | POA: Diagnosis not present

## 2021-08-13 DIAGNOSIS — R29898 Other symptoms and signs involving the musculoskeletal system: Secondary | ICD-10-CM | POA: Diagnosis not present

## 2021-08-13 MED ORDER — DULOXETINE HCL 30 MG PO CPEP: ORAL_CAPSULE | ORAL | 0 refills | Status: DC

## 2021-08-13 MED ORDER — CYCLOBENZAPRINE HCL 5 MG PO TABS: 5.0000 mg | ORAL_TABLET | Freq: Every evening | ORAL | 0 refills | Status: DC | PRN

## 2021-08-13 MED ORDER — DICLOFENAC SODIUM 50 MG PO TBEC: 50.0000 mg | DELAYED_RELEASE_TABLET | Freq: Two times a day (BID) | ORAL | 0 refills | Status: DC

## 2021-08-13 NOTE — Progress Notes (Signed)
Primary Care / Sports Medicine Office Visit  Patient Information:  Patient ID: Courtney Cervantes, female DOB: [birth year] Age: 65 y.o. MRN: [id number]   Courtney Cervantes is a pleasant 65 y.o. female presenting with the following:  Chief Complaint  Patient presents with   New Patient (Initial Visit)   Back Pain    Lumbar; x20 years; X-Ray 07/02/21; worse when bending down at the waist; 9/10 pain   Hip Pain    Left; x5 years; X-Ray 07/02/21; intermittent; worse with walking, aching, sore, sharp; was taking baclofen, but stopped because of side effects of sedation; 8/10 pain    Review of Systems pertinent details above   Patient Active Problem List   Diagnosis Date Noted   Primary osteoarthritis of left hip 08/13/2021   Muscular deconditioning 08/13/2021   Spondylosis of lumbosacral region without myelopathy or radiculopathy 08/13/2021   Mixed hyperlipidemia 07/26/2021   Primary osteoarthritis involving multiple joints 06/29/2021   Past Medical History:  Diagnosis Date   Arthritis    Outpatient Encounter Medications as of 08/13/2021  Medication Sig   acetaminophen (TYLENOL) 500 MG tablet Take 1,000 mg by mouth every 6 (six) hours as needed for moderate pain.   cyclobenzaprine (FLEXERIL) 5 MG tablet Take 1-2 tablets (5-10 mg total) by mouth at bedtime as needed for muscle spasms.   diclofenac (VOLTAREN) 50 MG EC tablet Take 1 tablet (50 mg total) by mouth 2 (two) times daily.   diphenhydrAMINE HCl (BENADRYL PO) Take by mouth at bedtime.   DULoxetine (CYMBALTA) 30 MG capsule Take 1 capsule (30 mg total) by mouth daily for 14 days, THEN 2 capsules (60 mg total) daily for 16 days.   MAGNESIUM OXIDE PO Take 1 tablet by mouth daily as needed.   naproxen sodium (ALEVE) 220 MG tablet Take 440 mg by mouth 2 (two) times daily as needed.   vitamin B-12 (CYANOCOBALAMIN) 1000 MCG tablet Take 1,000 mcg by mouth daily.   [DISCONTINUED] baclofen (LIORESAL) 10 MG tablet Take 0.5-1 tablets  (5-10 mg total) by mouth 3 (three) times daily as needed for muscle spasms. (Patient not taking: Reported on 08/13/2021)   [DISCONTINUED] ibuprofen (ADVIL) 400 MG tablet Take 400 mg by mouth every 6 (six) hours as needed.   No facility-administered encounter medications on file as of 08/13/2021.   Past Surgical History:  Procedure Laterality Date   APPENDECTOMY  1980   TUBAL LIGATION Bilateral 1994   UTERINE FIBROID SURGERY  1980    Vitals:   08/13/21 1319  BP: 122/72  Pulse: 87  Temp: 98.4 F (36.9 C)  SpO2: 95%   Vitals:   08/13/21 1319  Weight: 208 lb (94.3 kg)  Height: 5\' 9"  (1.753 m)   Body mass index is 30.72 kg/m.  No results found.   Independent interpretation of notes and tests performed by another provider:   Independent interpretation of lumbar x-ray films dated 07/02/2021 reveal overall maintained intervertebral space, anterior endplate osteophytes noted at L5, L4, L3 inferiorly, and L1, facet hypertrophy noted at the L4-5 articulation Independent to interpretation of left hip X-ray dated 07/02/2021 reveals moderate right and moderate to severe left hip osteoarthritis with subchondral sclerosis, deformation of the femoral head on the left, and acetabular osteophytes, no acute osseous process identified  Procedures performed:   None  Pertinent History, Exam, Impression, and Recommendations:   Primary osteoarthritis of left hip Patient with longstanding low back and left hip pain in the setting of polyarthropathy.  Her pain  is posterolateral left hip, low back symmetrically, denies radiation distally, no paresthesias, no weakness.  Examination shows significant deconditioning and tightness throughout the deep hip and pelvis with minimal adduction noted bilaterally, positive Ober's test, positive FADIR, FABER limited due to limitations in external rotation, negative straight leg raise test bilateral, tenderness at the SI joints symmetrically.  Her findings are most  consistent with underlying osteoarthritis related arthralgia of the left hip, multilevel spondyloarthropathy at the lower lumbar spine with focality at the L5-S1 and SI joints bilaterally.  These are all noted in the setting of significant tightness and deconditioning throughout the deep hip/core musculature.  I reviewed the same with the patient as well as treatment strategies moving forward.  Patient to start physical therapy, they will oversee a primarily home-based program given her stated motivation to do the same, duloxetine 30 mg x 2 weeks then 60 mg until follow-up, diclofenac, and cyclobenzaprine.  She is to return for follow-up in 4 weeks, if focal areas of symptomatology noted, local injections to be considered at the hip joint or periarticular musculature as an adjunct to physical therapy.  I have discussed that her goal be symptom control with long-term continued work towards addressing the deconditioning.   Muscular deconditioning See additional assessment(s) for plan details.  Spondylosis of lumbosacral region without myelopathy or radiculopathy See additional assessment(s) for plan details.    Orders & Medications Meds ordered this encounter  Medications   diclofenac (VOLTAREN) 50 MG EC tablet    Sig: Take 1 tablet (50 mg total) by mouth 2 (two) times daily.    Dispense:  60 tablet    Refill:  0   cyclobenzaprine (FLEXERIL) 5 MG tablet    Sig: Take 1-2 tablets (5-10 mg total) by mouth at bedtime as needed for muscle spasms.    Dispense:  60 tablet    Refill:  0   DULoxetine (CYMBALTA) 30 MG capsule    Sig: Take 1 capsule (30 mg total) by mouth daily for 14 days, THEN 2 capsules (60 mg total) daily for 16 days.    Dispense:  46 capsule    Refill:  0   Orders Placed This Encounter  Procedures   Ambulatory referral to Physical Therapy     Return in about 4 weeks (around 09/10/2021).     Jerrol Banana, MD   Primary Care Sports Medicine [facility]

## 2021-08-13 NOTE — Assessment & Plan Note (Signed)
Patient with longstanding low back and left hip pain in the setting of polyarthropathy.  Her pain is posterolateral left hip, low back symmetrically, denies radiation distally, no paresthesias, no weakness.  Examination shows significant deconditioning and tightness throughout the deep hip and pelvis with minimal adduction noted bilaterally, positive Ober's test, positive FADIR, FABER limited due to limitations in external rotation, negative straight leg raise test bilateral, tenderness at the SI joints symmetrically.  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  These are all noted in the setting of significant tightness and deconditioning throughout the deep hip/core musculature.  I reviewed the same with the patient as well as treatment strategies moving forward.  Patient to start physical therapy, they will oversee a primarily home-based program given her stated motivation to do the same, duloxetine 30 mg x 2 weeks then 60 mg until follow-up, diclofenac, and cyclobenzaprine.  She is to return for follow-up in 4 weeks, if focal areas of symptomatology noted, local injections to be considered at the hip joint or periarticular musculature as an adjunct to physical therapy.  I have discussed that her goal be symptom control with long-term continued work towards addressing the deconditioning.

## 2021-08-13 NOTE — Patient Instructions (Signed)
-   Referral coordinator will contact you for physical therapy scheduling - PT will teach you home exercises to perform daily - Stop baclofen - Start diclofenac every 12 hours with food (no other NSAIDs while on this medication, acetaminophen/Tylenol okay) - Start daily duloxetine (Cymbalta) - Can dose 1-2 tablet cyclobenzaprine (muscle relaxer) for muscle tightness pain - Return for follow-up in 4 weeks, contact our office for any questions

## 2021-08-13 NOTE — Assessment & Plan Note (Signed)
See additional assessment(s) for plan details. 

## 2021-08-28 ENCOUNTER — Ambulatory Visit: Payer: Medicare Other | Attending: Family Medicine

## 2021-08-28 ENCOUNTER — Other Ambulatory Visit: Payer: Self-pay

## 2021-08-28 DIAGNOSIS — R2689 Other abnormalities of gait and mobility: Secondary | ICD-10-CM

## 2021-08-28 DIAGNOSIS — M6281 Muscle weakness (generalized): Secondary | ICD-10-CM | POA: Diagnosis present

## 2021-08-28 DIAGNOSIS — M25552 Pain in left hip: Secondary | ICD-10-CM

## 2021-08-28 NOTE — Therapy (Signed)
[facility] [address]. [place], [place], [address] Phone: [phone]   Fax:  [phone]  Physical Therapy Evaluation  Patient Details  Name: Courtney Cervantes MRN: [id number] Date of Birth: 01/13/1956 Referring Provider (PT): Dr. Ashley Royalty   Encounter Date: 08/28/2021   PT End of Session - 08/28/21 1249     Visit Number 1    Number of Visits 17    Date for PT Re-Evaluation 10/23/21    Authorization Type eval: 08/28/21    PT Start Time 0930    PT Stop Time 1015    PT Time Calculation (min) 45 min    Activity Tolerance Patient tolerated treatment well    Behavior During Therapy [facility] for tasks assessed/performed             Past Medical History:  Diagnosis Date   Arthritis     Past Surgical History:  Procedure Laterality Date   APPENDECTOMY  1980   TUBAL LIGATION Bilateral 1994   UTERINE FIBROID SURGERY  1980    There were no vitals filed for this visit.    Subjective Assessment - 08/28/21 0920     Subjective L hip pain    Pertinent History Pt has a history of chronic low back pain and L hip soreness intermittently for years. When her L hip first started hurting it would occur at night and wake her up. At least 5 years ago the hip pain started to worsen however due to COVID she delayed seeking care. She did see her PCP in [place] who suggested a consult for a L hip replacement however pt would like to avoid surgery. She saw Dr. Ashley Royalty who performed plain film radiographs and per medical record independent interpretation of lumbar x-ray films dated 07/02/2021 revealed overall maintained intervertebral space, anterior endplate osteophytes noted at L5, L4, L3 inferiorly, and L1, facet hypertrophy noted at the L4-5 articulation. Independent interpretation of left hip X-ray dated 07/02/2021 reveals moderate right and moderate to severe left hip osteoarthritis with subchondral sclerosis, deformation of the femoral head on the  left, and acetabular osteophytes, no acute osseous process identified. Based on imaging and physical exam Dr. 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 She was prescribed duloxetine, diclofenac, and the muscle relaxers which have helped with her pain. Pt was referred to PT to focus on attaining maximal range of motion and improving strength. Pt reports that she heavily favors her RLE when standing. 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 She denies and LE weakness, buckling, or paresthesias.    Limitations Sitting;Standing    Diagnostic tests see history    Patient Stated Goals "I want to start walking and lose weight/get in shape" Decrease L hip pian    Currently in Pain? No/denies    Pain Score 0-No pain   Worst: 10/10, Best: 0/10   Pain Location Hip    Pain Orientation Left    Pain Descriptors / Indicators Tightness;Other (Comment)   "Raw"   Pain Type Chronic pain    Pain Radiating Towards None    Pain Onset More than a month ago    Pain Frequency Intermittent    Aggravating Factors  bending forward (back), sitting for extended periods, crossing legs, some pain with WB on LLE, lifting  does not aggravate the pain    Pain Relieving Factors rest, laying flat (helps back pain), laying on R side helps L hip pain hasn't tried ice or heat,    Effect of Pain on Daily Activities Limits her ability to paint                [facility] PT Assessment - 08/28/21 0921       Assessment   Medical Diagnosis Muscular deconditioning, primary OA of L hip, spondylosis of lumbosacral region without myelopathy or radiculopathy    Referring Provider (PT) Dr. Ashley Royalty    Onset Date/Surgical Date 08/28/16    Hand Dominance Right    Next MD Visit Mid October    Prior Therapy None  for this issue      Precautions   Precautions None      Restrictions   Weight Bearing Restrictions No               SUBJECTIVE Chief complaint:   History: Pt has a history of chronic low back pain and L hip soreness intermittently for years. When her L hip first started hurting it would occur at night and wake her up. At least 5 years ago the hip pain started to worsen however due to COVID she delayed seeking care. She did see her PCP in [place] who suggested a consult for a L hip replacement however pt would like to avoid surgery. She saw Dr. Ashley Royalty who performed plain film radiographs and per medical record independent interpretation of lumbar x-ray films dated 07/02/2021 revealed overall maintained intervertebral space, anterior endplate osteophytes noted at L5, L4, L3 inferiorly, and L1, facet hypertrophy noted at the L4-5 articulation. Independent interpretation of left hip X-ray dated 07/02/2021 reveals moderate right and moderate to severe left hip osteoarthritis with subchondral sclerosis, deformation of the femoral head on the left, and acetabular osteophytes, no acute osseous process identified. Based on imaging and physical exam Dr. 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 She was prescribed duloxetine, diclofenac, and the muscle relaxers which have helped with her pain. Pt was referred to PT to focus on attaining maximal range of motion and improving strength. Pt reports that she heavily favors her RLE when standing. 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 She denies and LE weakness, buckling, or paresthesias.   Pain location: anterolateral hip, low back symmetrically bilaterally; Pain: Present 0/10, Best 0/10, Worst 10/10 Pain quality: "super  tight pain" in back, "raw" pain in L hip/tightness Radiating pain: No  Numbness/Tingling: No 24 hour pain behavior: if active it hurts more toward the end of the day (feels better in the AM) Aggravating factors: bending forward (back), sitting for extended periods, crossing legs, some pain with WB on LLE, lifting does not aggravate the pain Easing factors: rest, laying flat (helps back pain), laying on R side helps L hip pain hasn't tried ice or heat,  How long can you sit: 10 minutes How long can you stand: How long can you walk: History of back injury, pain, surgery, or therapy: No Follow-up appointment with MD: Yes, mid October Dominant hand: right Imaging: Yes, see history  Falls in the last 6 months: No  Occupational demands: Retired Librarian, academic: reading,  Goals: "I want to start walking and lose weight/get in  shape" Red flags (bowel/bladder changes, saddle paresthesia, personal history of cancer, chills/fever, night sweats, unrelenting pain, first onset of insidious LBP <18 y/o) Negative    OBJECTIVE  Mental Status Patient is oriented to person, place and time.  Recent memory is intact.  Remote memory is intact.  Attention span and concentration are intact.  Expressive speech is intact.  Patient's fund of knowledge is within normal limits for educational level.  SENSATION: Deferred   MUSCULOSKELETAL: Tremor: None Bulk: Normal Tone: Normal No visible step-off along spinal column  Posture Difficulty extending L hip to neutral in supine Lumbar lordosis: excessive lordosis noted in sitting and supine;  Gait Antalgic gait on RLE noted with significant forward trunk lean due to lack of L hip extension ROM.    Palpation No pain to palpation in anterior or lateral L hip. Positive pain with palpation to deep L hip external rotators which refers pain to the anterior L groin.    Strength (out of 5) R/L 5/4+ Hip flexion 5/4+* Hip ER 5/5 Hip IR 4-/4- Hip  abduction 5/4- Hip adduction <3/<3 Hip extension 5/5 Knee extension 4/4 Knee flexion 5/5 Ankle dorsiflexion *Indicates pain   AROM (degrees) R/L (all movements include overpressure unless otherwise stated) Lumbar forward flexion (65): WNL Lumbar extension (30): mild loss Lumbar lateral flexion (25): R: mild loss  L: mild loss*  Thoracic and Lumbar rotation (30 degrees):  R: mild loss L: mod loss* Hip IR (0-45): R: 60 L: 15 Hip ER (0-45): R: 27 L: 15 Hip Flexion (0-125: mild limitation *Indicates pain  Repeated Movements Deferred   Muscle Length Hamstrings: R: 80 degrees L: 85 degrees  Ely: WNL on the R, Limited and painful into left hip on the L (L hip goes into flexion during L knee flexion) Thomas: Positive on the L with pt unable to reach neutral in supine; Ober: Positive bilaterally;   Passive Accessory Intervertebral Motion (PAIVM) Pt denies reproduction of back pain with CPA L1-L5 and UPA bilaterally L1-L5. Generally hypomobile throughout    SPECIAL TESTS Lumbar Radiculopathy and Discogenic: Centralization and Peripheralization (SN 92, -LR 0.12): Not examined Slump (SN 83, -LR 0.32): R: Negative L: Negative SLR (SN 92, -LR 0.29): R: Negative L:  Negative Crossed SLR (SP 90): R: Negative L: Negative  Facet Joint: Extension-Rotation (SN 100, -LR 0.0): R: Negative L: Negative  Lumbar Foraminal Stenosis: Lumbar quadrant (SN 70): R: Negative L: Negative  Hip: FABER (SN 81): R: Negative L: Positive FADIR (SN 94): R: Negative L: Positive Hip scour (SN 50): R: Negative L: Negative  SIJ:  Thigh Thrust (SN 88, -LR 0.18) : R: Not examined L: Not examined Sacral Thrust: Positive  Piriformis Syndrome: FAIR Test (SN 88, SP 83): R: Not examined L: Not examined  Functional Tasks Deferred              Objective measurements completed on examination: See above findings.                PT Education - 08/28/21 1249     Education Details Plan of  care and prone positioning    Person(s) Educated Patient    Methods Explanation    Comprehension Verbalized understanding              PT Short Term Goals - 08/28/21 1250       PT SHORT TERM GOAL #1   Title Pt will be independent with HEP in order to improve strength and decrease L hip pain in order  to improve pain-free function at home, when painting, and with exercise    Time 4    Period Weeks    Status New    Target Date 09/25/21      PT SHORT TERM GOAL #2   Title Pt will decrease worst L hip pain as reported on NPRS by at least 3 points in order to demonstrate clinically significant reduction in hip pain.    Baseline 08/28/21: worst: 10/10    Time 8    Period Weeks    Status New    Target Date 09/25/21               PT Long Term Goals - 08/28/21 1252       PT LONG TERM GOAL #1   Title Pt will increase FOTO to at least 58 in order to demonstrate significant improvement in function related to L hip pain    Baseline 08/28/21: 40    Time 8    Period Weeks    Status New    Target Date 10/23/21      PT LONG TERM GOAL #2   Title Pt will decrease worst pain as reported on NPRS to no greater than 4/10 in order to demonstrate clinically significant reduction in pain.    Baseline 08/28/21: worst: 10/10    Time 8    Period Weeks    Status New    Target Date 10/23/21      PT LONG TERM GOAL #3   Title Pt will increase strength of  L hip abduction and extension to at least 4/5 in order to demonstrate improvement in strength and function.    Baseline 08/28/21: L hip abduction: 4-/5, extension: <3/5;    Time 8    Period Weeks    Status New    Target Date 10/23/21                    Plan - 08/28/21 1204     Clinical Impression Statement Pt is a pleasant 65 year-old female referred for L hip pain. PT examination reveals significant deficits in left hip range of motion.  She has severe limitations in left hip internal and external rotation as well as  extension.  Patient is unable to extend her left hip to neutral.  She reports pain with weightbearing on left lower extremity and due to pain and range of motion deficits she has an antalgic gait with a forward leaning posture. No pain to palpation in anterior or lateral L hip however pt reports familiar anterior groin pain with palpation of deep L hip external rotators.  Significant weakness in left hip extension, abduction, and adduction. Pt presents with deficits in strength, mobility, range of motion, and pain. She will benefit from skilled PT services to address deficits and return to pain-free function at home and with leisure activities.    Personal Factors and Comorbidities Age;Comorbidity 1;Time since onset of injury/illness/exacerbation    Comorbidities OA    Examination-Activity Limitations Bend    Examination-Participation Restrictions Community Activity;Occupation    Stability/Clinical Decision Making Stable/Uncomplicated    Clinical Decision Making Low    Rehab Potential Good    PT Frequency 2x / week    PT Duration 8 weeks    PT Treatment/Interventions ADLs/Self Care Home Management;Aquatic Therapy;Biofeedback;Canalith Repostioning;Iontophoresis 4mg /ml Dexamethasone;Moist Heat;Electrical Stimulation;Cryotherapy;Traction;Ultrasound;Gait training;Stair training;Functional mobility training;Therapeutic activities;Therapeutic exercise;Neuromuscular re-education;Manual techniques;Passive range of motion;Dry needling;Vestibular;Spinal Manipulations;Joint Manipulations    PT Next Visit Plan Assess prone positioning, initiate additional  stretches and manual techniques for left hip, progress HEP;    PT Home Exercise Plan Prone positioning regularly throughout the day with gradually increasing frequency and duration    Consulted and Agree with Plan of Care Patient             Patient will benefit from skilled therapeutic intervention in order to improve the following deficits and  impairments:  Pain, Decreased strength, Decreased range of motion, Impaired flexibility  Visit Diagnosis: Pain in left hip  Muscle weakness (generalized)  Other abnormalities of gait and mobility     Problem List Patient Active Problem List   Diagnosis Date Noted   Primary osteoarthritis of left hip 08/13/2021   Muscular deconditioning 08/13/2021   Spondylosis of lumbosacral region without myelopathy or radiculopathy 08/13/2021   Mixed hyperlipidemia 07/26/2021   Primary osteoarthritis involving multiple joints 06/29/2021   Lynnea Maizes PT, DPT, GCS  Kaileb Monsanto, PT 08/29/2021, 8:48 AM  [facility] [address] [place], [place], [address] Phone: [phone]   Fax:  [phone]  Name: Courtney Cervantes MRN: [id number] Date of Birth: [birth year]

## 2021-09-04 ENCOUNTER — Other Ambulatory Visit: Payer: Self-pay

## 2021-09-04 ENCOUNTER — Ambulatory Visit: Payer: Medicare Other | Attending: Family Medicine

## 2021-09-04 DIAGNOSIS — M25552 Pain in left hip: Secondary | ICD-10-CM | POA: Insufficient documentation

## 2021-09-04 DIAGNOSIS — M6281 Muscle weakness (generalized): Secondary | ICD-10-CM | POA: Diagnosis present

## 2021-09-04 DIAGNOSIS — R2689 Other abnormalities of gait and mobility: Secondary | ICD-10-CM | POA: Insufficient documentation

## 2021-09-04 NOTE — Therapy (Signed)
[facility] [address] [place], [place], [address] Phone: [phone]   Fax:  [phone]  Physical Therapy Treatment  Patient Details  Name: Courtney Cervantes MRN: [id number] Date of Birth: 01/22/1956 Referring Provider (PT): Dr. Ashley Royalty   Encounter Date: 09/04/2021   PT End of Session - 09/04/21 0813     Visit Number 2    Number of Visits 17    Date for PT Re-Evaluation 10/23/21    Authorization Type Medicare    Authorization Time Period 08/28/21-10/23/21    Progress Note Due on Visit 10    PT Start Time 0802    PT Stop Time 0842    PT Time Calculation (min) 40 min    Activity Tolerance Patient tolerated treatment well;No increased pain    Behavior During Therapy WFL for tasks assessed/performed             Past Medical History:  Diagnosis Date   Arthritis     Past Surgical History:  Procedure Laterality Date   APPENDECTOMY  1980   TUBAL LIGATION Bilateral 1994   UTERINE FIBROID SURGERY  1980    There were no vitals filed for this visit.   Subjective Assessment - 09/04/21 0807     Subjective Pt reports shes been busy preparing for family to visit soon from the [facility]. Pt worked on prone positioning some, had soreness in the posterior hip.    Pertinent History Pt has a history of chronic low back pain and L hip soreness intermittently for years. When her L hip first started hurting it would occur at night and wake her up. At least 5 years ago the hip pain started to worsen however due to COVID she delayed seeking care. She did see her PCP in [place] who suggested a consult for a L hip replacement however pt would like to avoid surgery. She saw Dr. Ashley Royalty who performed plain film radiographs and per medical record independent interpretation of lumbar x-ray films dated 07/02/2021 revealed overall maintained intervertebral space, anterior endplate osteophytes noted at L5, L4, L3 inferiorly, and L1, facet  hypertrophy noted at the L4-5 articulation. Independent interpretation of left hip X-ray dated 07/02/2021 reveals moderate right and moderate to severe left hip osteoarthritis with subchondral sclerosis, deformation of the femoral head on the left, and acetabular osteophytes, no acute osseous process identified. Based on imaging and physical exam Dr. 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 She was prescribed duloxetine, diclofenac, and the muscle relaxers which have helped with her pain. Pt was referred to PT to focus on attaining maximal range of motion and improving strength. Pt reports that she heavily favors her RLE when standing. 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 She denies and LE weakness, buckling, or paresthesias.    Currently in Pain? No/denies             INTERVENTION THIS DATE: -AA/ROM bilat hips Nustep, level 2, seat 11, arms 11 -2nd step lunge stretch, cues for upright trunk, BUE support on rails 2x30sec bilat  -FABER stretch, foot on 12" box 2x60sec bilat (wide abducted knee angle) (potential HEP item) -prone frong stretch left (no subjective stretch felt; low likelihood pectineus, adductor brevis component) -short sitting frong stretch, leaning against wall, using arms to  increase stretch 1x60sec (potential HEP item)  -Hooklying glute bridge 1x10 (appears stuck in Left hip hike position (elevated iliac crest estimate >15 degrees); 3" foam roller across pelvis to visualize level pelvis -yellow phys ball squeeze 15x5secH  -black T band clam 1x15  -Hooklying glute bridge 1x10 (appears stuck in Left hip hike position (elevated iliac crest estimate >15 degrees) -yellow phys ball squeeze 15x5secH  -black T band clam 1x15     PT Education -  09/04/21 0810     Education Details Reviewed    Person(s) Educated Patient    Methods Explanation;Demonstration    Comprehension Verbalized understanding              PT Short Term Goals - 08/28/21 1250       PT SHORT TERM GOAL #1   Title Pt will be independent with HEP in order to improve strength and decrease L hip pain in order to improve pain-free function at home, when painting, and with exercise    Time 4    Period Weeks    Status New    Target Date 09/25/21      PT SHORT TERM GOAL #2   Title Pt will decrease worst L hip pain as reported on NPRS by at least 3 points in order to demonstrate clinically significant reduction in hip pain.    Baseline 08/28/21: worst: 10/10    Time 8    Period Weeks    Status New    Target Date 09/25/21               PT Long Term Goals - 08/28/21 1252       PT LONG TERM GOAL #1   Title Pt will increase FOTO to at least 58 in order to demonstrate significant improvement in function related to L hip pain    Baseline 08/28/21: 40    Time 8    Period Weeks    Status New    Target Date 10/23/21      PT LONG TERM GOAL #2   Title Pt will decrease worst pain as reported on NPRS to no greater than 4/10 in order to demonstrate clinically significant reduction in pain.    Baseline 08/28/21: worst: 10/10    Time 8    Period Weeks    Status New    Target Date 10/23/21      PT LONG TERM GOAL #3   Title Pt will increase strength of  L hip abduction and extension to at least 4/5 in order to demonstrate improvement in strength and function.    Baseline 08/28/21: L hip abduction: 4-/5, extension: <3/5;    Time 8    Period Weeks    Status New    Target Date 10/23/21                   Plan - 09/04/21 5400     Clinical Impression Statement Pt returns to OPPT for first treatment post evaluation. Reviewed HEP assignment. Continued with current plan of care as laid out in evaluation. Trialed a few variations of hip stretches with  intermittent success, high customization required due to considerable ROM limitations. Pt remains motivated to advance progress toward goals. Author maintains all interventions within appropriate level of intensity as not to purposefully exacerbate pain. Pt does require varying levels of assistance and cuing for completion of exercises for correct form and sometimes due to pain/weakness. Despite significant loss of ROM, subjective stiffness and pain are not  large limitations. Pt has far more limitation in weakness of hip, and well compensated changes in motor planning due to joint mobility/task mismatch.    Personal Factors and Comorbidities Age;Comorbidity 1;Time since onset of injury/illness/exacerbation    Comorbidities OA    Examination-Activity Limitations Bend    Examination-Participation Restrictions Community Activity;Occupation    Stability/Clinical Decision Making Stable/Uncomplicated    Clinical Decision Making Low    Rehab Potential Good    PT Frequency 2x / week    PT Duration 8 weeks    PT Treatment/Interventions ADLs/Self Care Home Management;Aquatic Therapy;Biofeedback;Canalith Repostioning;Iontophoresis 4mg /ml Dexamethasone;Moist Heat;Electrical Stimulation;Cryotherapy;Traction;Ultrasound;Gait training;Stair training;Functional mobility training;Therapeutic activities;Therapeutic exercise;Neuromuscular re-education;Manual techniques;Passive range of motion;Dry needling;Vestibular;Spinal Manipulations;Joint Manipulations    PT Next Visit Plan Assess prone positioning, initiate additional stretches and manual techniques for left hip, progress HEP;    PT Home Exercise Plan Prone positioning regularly throughout the day with gradually increasing frequency and duration    Consulted and Agree with Plan of Care Patient             Patient will benefit from skilled therapeutic intervention in order to improve the following deficits and impairments:  Pain, Decreased strength, Decreased  range of motion, Impaired flexibility  Visit Diagnosis: Pain in left hip  Muscle weakness (generalized)  Other abnormalities of gait and mobility     Problem List Patient Active Problem List   Diagnosis Date Noted   Primary osteoarthritis of left hip 08/13/2021   Muscular deconditioning 08/13/2021   Spondylosis of lumbosacral region without myelopathy or radiculopathy 08/13/2021   Mixed hyperlipidemia 07/26/2021   Primary osteoarthritis involving multiple joints 06/29/2021   11:35 AM, 09/04/21 11/04/21, PT, DPT Physical Therapist - [facility] in [place]  [phone] (Office)     Irvine C, PT 09/04/2021, 8:36 AM  [facility] [address] [place], [place], [place] Phone: [phone]   Fax:  [phone]  Name: Courtney Cervantes MRN: [id number] Date of Birth: Apr 22, 1956

## 2021-09-09 ENCOUNTER — Other Ambulatory Visit: Payer: Self-pay | Admitting: Family Medicine

## 2021-09-09 DIAGNOSIS — M1612 Unilateral primary osteoarthritis, left hip: Secondary | ICD-10-CM

## 2021-09-09 DIAGNOSIS — M47817 Spondylosis without myelopathy or radiculopathy, lumbosacral region: Secondary | ICD-10-CM

## 2021-09-09 DIAGNOSIS — R29898 Other symptoms and signs involving the musculoskeletal system: Secondary | ICD-10-CM

## 2021-09-09 NOTE — Telephone Encounter (Signed)
Requested medication (s) are due for refill today: yes  Requested medication (s) are on the active medication list: yes  Last refill:  Voltaren: 08/13/21     Duloxetine: 08/13/21  Future visit scheduled: yes  Notes to clinic:  Pt has an appt with Dr. Ashley Royalty tomorrow Each prescription  ends 09/12/21   Requested Prescriptions  Pending Prescriptions Disp Refills   diclofenac (VOLTAREN) 50 MG EC tablet [Pharmacy Med Name: DICLOFENAC SODIUM 50MG  DR TABLETS] 60 tablet 0    Sig: TAKE 1 TABLET(50 MG) BY MOUTH TWICE DAILY     Analgesics:  NSAIDS Failed - 09/09/2021  3:18 AM      Failed - HGB in normal range and within 360 days    Hemoglobin  Date Value Ref Range Status  07/19/2021 16.2 (H) 11.7 - 15.5 g/dL Final          Passed - Cr in normal range and within 360 days    Creat  Date Value Ref Range Status  07/19/2021 0.88 0.50 - 1.05 mg/dL Final          Passed - Patient is not pregnant      Passed - Valid encounter within last 12 months    Recent Outpatient Visits           3 weeks ago Primary osteoarthritis of left hip   [facility] 07/21/2021, MD   1 month ago Mixed hyperlipidemia   [facility] [place], [place], DO   2 months ago Primary osteoarthritis involving multiple joints   [facility] [place], [place], DO       Future Appointments             Tomorrow Netta Neat, MD [facility], [facility]   In 4 months [facility], Althea Charon, DO [facility], [facility]             DULoxetine (CYMBALTA) 30 MG capsule [Pharmacy Med Name: DULOXETINE DR 30MG  CAPSULES] 46 capsule 0    Sig: TAKE 1 CAPSULE(30 MG) BY MOUTH DAILY FOR 14 DAYS THEN TAKE 2 CAPSULES(60 MG) BY MOUTH DAILY FOR 16 DAYS     Psychiatry: Antidepressants - SNRI Passed - 09/09/2021  3:18 AM      Passed - Last BP in normal range    BP Readings from Last 1 Encounters:  08/13/21 122/72          Passed - Valid encounter  within last 6 months    Recent Outpatient Visits           3 weeks ago Primary osteoarthritis of left hip   [facility] 11/09/2021, MD   1 month ago Mixed hyperlipidemia   [facility] [place], [facility], DO   2 months ago Primary osteoarthritis involving multiple joints   [facility] [place], [facility], DO       Future Appointments             Tomorrow [place], MD [facility], [facility]   In 4 months Jerrol Banana, [facility], DO [facility], [facility]

## 2021-09-10 ENCOUNTER — Other Ambulatory Visit: Payer: Self-pay

## 2021-09-10 ENCOUNTER — Encounter: Payer: Self-pay | Admitting: Family Medicine

## 2021-09-10 ENCOUNTER — Ambulatory Visit (INDEPENDENT_AMBULATORY_CARE_PROVIDER_SITE_OTHER): Payer: Medicare Other | Admitting: Family Medicine

## 2021-09-10 DIAGNOSIS — M47817 Spondylosis without myelopathy or radiculopathy, lumbosacral region: Secondary | ICD-10-CM

## 2021-09-10 DIAGNOSIS — R29898 Other symptoms and signs involving the musculoskeletal system: Secondary | ICD-10-CM | POA: Diagnosis not present

## 2021-09-10 DIAGNOSIS — M1612 Unilateral primary osteoarthritis, left hip: Secondary | ICD-10-CM | POA: Diagnosis not present

## 2021-09-10 MED ORDER — DICLOFENAC SODIUM 50 MG PO TBEC: 50.0000 mg | DELAYED_RELEASE_TABLET | Freq: Two times a day (BID) | ORAL | 0 refills | Status: DC | PRN

## 2021-09-10 MED ORDER — CYCLOBENZAPRINE HCL 10 MG PO TABS: 10.0000 mg | ORAL_TABLET | Freq: Every evening | ORAL | 0 refills | Status: DC | PRN

## 2021-09-10 MED ORDER — DULOXETINE HCL 60 MG PO CPEP: 60.0000 mg | ORAL_CAPSULE | Freq: Every day | ORAL | 0 refills | Status: DC

## 2021-09-10 NOTE — Assessment & Plan Note (Signed)
See additional assessment(s) for plan details. 

## 2021-09-10 NOTE — Telephone Encounter (Signed)
Please review. Dr. Matthews prescribed.  KP

## 2021-09-10 NOTE — Progress Notes (Signed)
Primary Care / Sports Medicine Office Visit  Patient Information:  Patient ID: Courtney Cervantes, female DOB: 1956-11-02 Age: 65 y.o. MRN: [id number]   Courtney Cervantes is a pleasant 65 y.o. 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 rad    Denies pain in office    Review of Systems pertinent details above   Patient Active Problem List   Diagnosis Date Noted   Primary osteoarthritis of left hip 08/13/2021   Muscular deconditioning 08/13/2021   Spondylosis of lumbosacral region without myelopathy or radiculopathy 08/13/2021   Mixed hyperlipidemia 07/26/2021   Primary osteoarthritis involving multiple joints 06/29/2021   Past Medical History:  Diagnosis Date   Arthritis    Outpatient Encounter Medications as of 09/10/2021  Medication Sig   acetaminophen (TYLENOL) 500 MG tablet Take 1,000 mg by mouth every 6 (six) hours as needed for moderate pain.   diclofenac (VOLTAREN) 50 MG EC tablet Take 1 tablet (50 mg total) by mouth 2 (two) times daily as needed for moderate pain.   diphenhydrAMINE HCl (BENADRYL PO) Take 1 capsule by mouth at bedtime.   DULoxetine (CYMBALTA) 60 MG capsule Take 1 capsule (60 mg total) by mouth daily.   MAGNESIUM OXIDE PO Take 1 tablet by mouth daily as needed.   vitamin B-12 (CYANOCOBALAMIN) 1000 MCG tablet Take 1,000 mcg by mouth daily.   [DISCONTINUED] cyclobenzaprine (FLEXERIL) 5 MG tablet Take 1-2 tablets (5-10 mg total) by mouth at bedtime as needed for muscle spasms.   [DISCONTINUED] diclofenac (VOLTAREN) 50 MG EC tablet Take 1 tablet (50 mg total) by mouth 2 (two) times daily.   [DISCONTINUED] DULoxetine (CYMBALTA) 30 MG capsule Take 1 capsule (30 mg total) by mouth daily for 14 days, THEN 2 capsules  (60 mg total) daily for 16 days.   cyclobenzaprine (FLEXERIL) 10 MG tablet Take 1 tablet (10 mg total) by mouth at bedtime as needed for muscle spasms.   [DISCONTINUED] naproxen sodium (ALEVE) 220 MG tablet Take 440 mg by mouth 2 (two) times daily as needed. (Patient not taking: Reported on 09/10/2021)   No facility-administered encounter medications on file as of 09/10/2021.   Past Surgical History:  Procedure Laterality Date   APPENDECTOMY  1980   TUBAL LIGATION Bilateral 1994   UTERINE FIBROID SURGERY  1980    Vitals:   09/10/21 0807  BP: 116/82  Pulse: 80  SpO2: 97%   Vitals:   09/10/21 0807  Weight: 210 lb (95.3 kg)  Height: 5\' 9"  (1.753 m)   Body mass index is 31.01 kg/m.  No results found.   Independent interpretation of notes and tests performed by another provider:   None  Procedures performed:   None  Pertinent History, Exam, Impression, and Recommendations:   Primary osteoarthritis of left hip Patient has demonstrated interval improvement and cites minimal to no pain on a regular basis, has been compliant with physical therapy, home exercises, and medication dosing.  She has noted no adverse effects from the medications prescribed.  She did have the first episode of pain recurrence, however this was following significantly increased physical activity (painting a room), did resolve with time and the medications.  Examination reveals subtle improvement in range of motion at the low back and hips though there is  significant persistent tightness, these motions are without pain as they were previously noted.  Straight leg raise negative, FABER and FADIR testing relatively benign though are limited due to range of motion constraints.  I have advised continued duloxetine 60 mg daily, she is to transition to as needed dosing of the diclofenac 50 mg, and cyclobenzaprine has been increased to 10 mg, this can be dosed nightly on an as-needed basis.  She is to continue with  physical therapy, home exercises, and maintain follow-up in 6 weeks for reevaluation.  If suboptimal progress noted, local injections, modification of pharmacotherapy to all be considered.  Muscular deconditioning See additional assessment(s) for plan details.  Spondylosis of lumbosacral region without myelopathy or radiculopathy See additional assessment(s) for plan details.   Orders & Medications Meds ordered this encounter  Medications   DULoxetine (CYMBALTA) 60 MG capsule    Sig: Take 1 capsule (60 mg total) by mouth daily.    Dispense:  90 capsule    Refill:  0   cyclobenzaprine (FLEXERIL) 10 MG tablet    Sig: Take 1 tablet (10 mg total) by mouth at bedtime as needed for muscle spasms.    Dispense:  90 tablet    Refill:  0   diclofenac (VOLTAREN) 50 MG EC tablet    Sig: Take 1 tablet (50 mg total) by mouth 2 (two) times daily as needed for moderate pain.    Dispense:  180 tablet    Refill:  0   No orders of the defined types were placed in this encounter.    Return in about 6 weeks (around 10/22/2021).     Jerrol Banana, MD   Primary Care Sports Medicine [facility]

## 2021-09-10 NOTE — Patient Instructions (Signed)
-   Continue duloxetine daily (60 mg capsule Rx) - Change to as-needed dosing of diclofenac 50 mg up to twice daily for pain - Dose nightly cyclobenzaprine 10 mg as-needed for muscle spasm - Continue physical therapy and home exercises - Return in 6 weeks

## 2021-09-10 NOTE — Assessment & Plan Note (Signed)
Patient has demonstrated interval improvement and cites minimal to no pain on a regular basis, has been compliant with physical therapy, home exercises, and medication dosing.  She has noted no adverse effects from the medications prescribed.  She did have the first episode of pain recurrence, however this was following significantly increased physical activity (painting a room), did resolve with time and the medications.  Examination reveals subtle improvement in range of motion at the low back and hips though there is significant persistent tightness, these motions are without pain as they were previously noted.  Straight leg raise negative, FABER and FADIR testing relatively benign though are limited due to range of motion constraints.  I have advised continued duloxetine 60 mg daily, she is to transition to as needed dosing of the diclofenac 50 mg, and cyclobenzaprine has been increased to 10 mg, this can be dosed nightly on an as-needed basis.  She is to continue with physical therapy, home exercises, and maintain follow-up in 6 weeks for reevaluation.  If suboptimal progress noted, local injections, modification of pharmacotherapy to all be considered.

## 2021-09-11 ENCOUNTER — Ambulatory Visit: Payer: Medicare Other

## 2021-09-11 DIAGNOSIS — M6281 Muscle weakness (generalized): Secondary | ICD-10-CM

## 2021-09-11 DIAGNOSIS — M25552 Pain in left hip: Secondary | ICD-10-CM

## 2021-09-11 NOTE — Patient Instructions (Signed)
Access Code: [email or web address] URL: [email or web address] Date: 09/11/2021 Prepared by: Ria Comment  Exercises Supine Bridge - 1 x daily - 7 x weekly - 2 sets - 10 reps Hooklying Clamshell with Resistance - 1 x daily - 7 x weekly - 2 sets - 10 reps Lying Prone - 3 x daily - 7 x weekly - 5 minutes hold Seated Figure 4 Piriformis Stretch - 3 x daily - 7 x weekly - 3 reps - 60s hold Seated Piriformis Stretch - 3 x daily - 7 x weekly - 3 reps - 60s hold

## 2021-09-11 NOTE — Therapy (Signed)
[facility] [address] [place], [place], [address] Phone: [phone]   Fax:  [phone]  Physical Therapy Treatment  Patient Details  Name: Courtney Cervantes MRN: [id number] Date of Birth: 1956/10/08 Referring Provider (PT): Dr. Ashley Royalty   Encounter Date: 09/11/2021   PT End of Session - 09/11/21 0807     Visit Number 3    Number of Visits 17    Date for PT Re-Evaluation 10/23/21    Authorization Type Medicare    Authorization Time Period 08/28/21-10/23/21    PT Start Time 0800    PT Stop Time 0845    PT Time Calculation (min) 45 min    Activity Tolerance Patient tolerated treatment well;No increased pain    Behavior During Therapy WFL for tasks assessed/performed             Past Medical History:  Diagnosis Date   Arthritis     Past Surgical History:  Procedure Laterality Date   APPENDECTOMY  1980   TUBAL LIGATION Bilateral 1994   UTERINE FIBROID SURGERY  1980    There were no vitals filed for this visit.   Subjective Assessment - 09/11/21 0804     Subjective Pt reports that she is doing well today. She denies any resting hip pain upon arrival. States that she has been laying on her stomach and notices some soreness in her posterior hip after stretching. However she feels like she is improving. She states that she saw Dr. Ashley Royalty who changed some of her medications but otherwise was pleased with her progress. No specific questions or concerns upon arrival.    Pertinent History Pt has a history of chronic low back pain and L hip soreness intermittently for years. When her L hip first started hurting it would occur at night and wake her up. At least 5 years ago the hip pain started to worsen however due to COVID she delayed seeking care. She did see her PCP in [place] who suggested a consult for a L hip replacement however pt would like to avoid surgery. She saw Dr. Ashley Royalty who performed plain film radiographs  and per medical record independent interpretation of lumbar x-ray films dated 07/02/2021 revealed overall maintained intervertebral space, anterior endplate osteophytes noted at L5, L4, L3 inferiorly, and L1, facet hypertrophy noted at the L4-5 articulation. Independent interpretation of left hip X-ray dated 07/02/2021 reveals moderate right and moderate to severe left hip osteoarthritis with subchondral sclerosis, deformation of the femoral head on the left, and acetabular osteophytes, no acute osseous process identified. Based on imaging and physical exam Dr. 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 She was prescribed duloxetine, diclofenac, and the muscle relaxers which have helped with her pain. Pt was referred to PT to focus on attaining maximal range of motion and improving strength. Pt reports that she heavily favors her RLE when standing. 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 She denies and LE weakness, buckling, or paresthesias.    Limitations Sitting;Standing    Diagnostic tests see history    Patient Stated Goals "I want to start walking and lose weight/get in shape" Decrease L hip pain               TREATMENT   Ther-ex NuStep L2 x  7 minutes for warm-up and L hip AAROM during interval history (4 minutes unbilled); Right side-lying left hip clam with manual resistance from therapist 2x10; Left side-lying left hip straight leg abduction 2x10; Supine left straight leg raise x10; Hooklying bridges with cues to maintain level pelvis 2x10;    Manual Therapy  Prone positioning with moist heat pack under left hip in order to improve extension range of motion; Prone left hip internal and external rotation stretching 2 x 60 seconds in each  direction; Prone left quadricep stretch 2 x 60 seconds; Prone left femur posterior to anterior mobilizations to improve left hip extension, grade 3, 30 seconds/bout x3 bouts; Attempted supine left hip FABER stretch however patient lacks adequate range due to position; Seated left hip cross body FADIR stretch x30 seconds (added to HEP);   Pt educated throughout session about proper posture and technique with exercises. Improved exercise technique, movement at target joints, use of target muscles after min to mod verbal, visual, tactile cues.    Patient demonstrates excellent motivation during session today.  Utilized prolonged prone positioning with moist heat to improve tissue extensibility and increase left hip extension range of motion.  Also utilized femoral posterior to anterior mobilizations for extension range of motion.  Incorporated left hip strengthening into session today and additional stretches added to HEP.  Patient encouraged to follow-up as scheduled. She will benefit from PT services to address deficits in strength, balance, and mobility in order to return to full function at home.                        PT Short Term Goals - 08/28/21 1250       PT SHORT TERM GOAL #1   Title Pt will be independent with HEP in order to improve strength and decrease L hip pain in order to improve pain-free function at home, when painting, and with exercise    Time 4    Period Weeks    Status New    Target Date 09/25/21      PT SHORT TERM GOAL #2   Title Pt will decrease worst L hip pain as reported on NPRS by at least 3 points in order to demonstrate clinically significant reduction in hip pain.    Baseline 08/28/21: worst: 10/10    Time 8    Period Weeks    Status New    Target Date 09/25/21               PT Long Term Goals - 08/28/21 1252       PT LONG TERM GOAL #1   Title Pt will increase FOTO to at least 58 in order to demonstrate significant improvement  in function related to L hip pain    Baseline 08/28/21: 40    Time 8    Period Weeks    Status New    Target Date 10/23/21      PT LONG TERM GOAL #2   Title Pt will decrease worst pain as reported on NPRS to no greater than 4/10 in order to demonstrate clinically significant reduction in pain.    Baseline 08/28/21: worst: 10/10    Time 8    Period Weeks    Status New    Target Date 10/23/21      PT LONG TERM GOAL #3   Title Pt will increase strength of  L hip abduction and extension to at least 4/5 in order to demonstrate improvement  in strength and function.    Baseline 08/28/21: L hip abduction: 4-/5, extension: <3/5;    Time 8    Period Weeks    Status New    Target Date 10/23/21                   Plan - 09/11/21 0807     Clinical Impression Statement Patient demonstrates excellent motivation during session today.  Utilized prolonged prone positioning with moist heat to improve tissue extensibility and increase left hip extension range of motion.  Also utilized femoral posterior to anterior mobilizations for extension range of motion.  Incorporated left hip strengthening into session today and additional stretches added to HEP.  Patient encouraged to follow-up as scheduled. She will benefit from PT services to address deficits in strength, balance, and mobility in order to return to full function at home.    Personal Factors and Comorbidities Age;Comorbidity 1;Time since onset of injury/illness/exacerbation    Comorbidities OA    Examination-Activity Limitations [place]    Examination-Participation Restrictions Community Activity;Occupation    Stability/Clinical Decision Making Stable/Uncomplicated    Rehab Potential Good    PT Frequency 2x / week    PT Duration 8 weeks    PT Treatment/Interventions ADLs/Self Care Home Management;Aquatic Therapy;Biofeedback;Canalith Repostioning;Iontophoresis 4mg /ml Dexamethasone;Moist Heat;Electrical  Stimulation;Cryotherapy;Traction;Ultrasound;Gait training;Stair training;Functional mobility training;Therapeutic activities;Therapeutic exercise;Neuromuscular re-education;Manual techniques;Passive range of motion;Dry needling;Vestibular;Spinal Manipulations;Joint Manipulations    PT Next Visit Plan Assess prone positioning, initiate additional stretches and manual techniques for left hip, progress HEP;    PT Home Exercise Plan Prone positioning regularly throughout the day with gradually increasing frequency and duration    Consulted and Agree with Plan of Care Patient             Patient will benefit from skilled therapeutic intervention in order to improve the following deficits and impairments:  Pain, Decreased strength, Decreased range of motion, Impaired flexibility  Visit Diagnosis: Pain in left hip  Muscle weakness (generalized)     Problem List Patient Active Problem List   Diagnosis Date Noted   Primary osteoarthritis of left hip 08/13/2021   Muscular deconditioning 08/13/2021   Spondylosis of lumbosacral region without myelopathy or radiculopathy 08/13/2021   Mixed hyperlipidemia 07/26/2021   Primary osteoarthritis involving multiple joints 06/29/2021   07/01/2021 PT, DPT, [facility]  Tasheema Perrone, PT 09/11/2021, 11:25 AM  [place] [facility] [address] [place], [place], [place] Phone: [phone]   Fax:  [phone]  Name: Courtney Cervantes MRN: [id number] Grumbles Date of Birth: 1956/05/16

## 2021-09-13 ENCOUNTER — Ambulatory Visit: Payer: Medicare Other

## 2021-09-13 ENCOUNTER — Other Ambulatory Visit: Payer: Self-pay

## 2021-09-13 DIAGNOSIS — M25552 Pain in left hip: Secondary | ICD-10-CM | POA: Diagnosis not present

## 2021-09-13 DIAGNOSIS — M6281 Muscle weakness (generalized): Secondary | ICD-10-CM

## 2021-09-13 DIAGNOSIS — R2689 Other abnormalities of gait and mobility: Secondary | ICD-10-CM

## 2021-09-13 NOTE — Therapy (Signed)
[facility] [address] [place], [place], [address] Phone: [phone]   Fax:  [phone]  Physical Therapy Treatment  Patient Details  Name: Courtney Cervantes MRN: [id number] Date of Birth: October 30, 1956 Referring Provider (PT): Dr. Ashley Royalty   Encounter Date: 09/13/2021   PT End of Session - 09/13/21 0935     Visit Number 4    Number of Visits 17    Date for PT Re-Evaluation 10/23/21    Authorization Type Medicare    Authorization Time Period 08/28/21-10/23/21    PT Start Time 0930    PT Stop Time 1015    PT Time Calculation (min) 45 min    Activity Tolerance Patient tolerated treatment well;No increased pain    Behavior During Therapy WFL for tasks assessed/performed             Past Medical History:  Diagnosis Date   Arthritis     Past Surgical History:  Procedure Laterality Date   APPENDECTOMY  1980   TUBAL LIGATION Bilateral 1994   UTERINE FIBROID SURGERY  1980    There were no vitals filed for this visit.   Subjective Assessment - 09/13/21 0934     Subjective Pt reports that she is doing well today. She denies any resting hip pain upon arrival but states that she has been having some soreness in her hip since her last therapy session. No specific questions upon arrival.    Pertinent History Pt has a history of chronic low back pain and L hip soreness intermittently for years. When her L hip first started hurting it would occur at night and wake her up. At least 5 years ago the hip pain started to worsen however due to COVID she delayed seeking care. She did see her PCP in [place] who suggested a consult for a L hip replacement however pt would like to avoid surgery. She saw Dr. Ashley Royalty who performed plain film radiographs and per medical record independent interpretation of lumbar x-ray films dated 07/02/2021 revealed overall maintained intervertebral space, anterior endplate osteophytes noted at L5, L4, L3  inferiorly, and L1, facet hypertrophy noted at the L4-5 articulation. Independent interpretation of left hip X-ray dated 07/02/2021 reveals moderate right and moderate to severe left hip osteoarthritis with subchondral sclerosis, deformation of the femoral head on the left, and acetabular osteophytes, no acute osseous process identified. Based on imaging and physical exam Dr. 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 She was prescribed duloxetine, diclofenac, and the muscle relaxers which have helped with her pain. Pt was referred to PT to focus on attaining maximal range of motion and improving strength. Pt reports that she heavily favors her RLE when standing. 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 She denies and LE weakness, buckling, or paresthesias.    Limitations Sitting;Standing    Diagnostic tests see history    Patient Stated Goals "I want to start walking and lose weight/get in shape" Decrease L hip pain               TREATMENT   Ther-ex NuStep L2 x 5 minutes for warm-up and L hip AAROM during interval history (4 minutes unbilled); Prone L hamstring curls with manual resistance from therapist 2 x 10; Prone L hip extension with  straight knee 2 x 10; Right side-lying left hip clam with manual resistance from therapist 2x10; Right side-lying left hip straight leg abduction 2x10; Attempted supine left straight leg raise but too uncomfortable for patient so regressed to marches x 10; Hooklying bridges with cues to maintain level pelvis 2 x 10;    Manual Therapy  Prone positioning with moist heat pack under left hip in order to improve extension range of motion while therapist performs CPA L1-L5, grade II-III, 30s/bout x 1 bout/level; Prone left hip  internal and external rotation stretching 2 x 60 seconds in each direction; Prone left quadricep stretch 2 x 60 seconds; Prone left femur posterior to anterior mobilizations to improve left hip extension, grade 3, performed 1 bout x 30s however pt report increase in pain and is unable to tolerate second bout;   Pt educated throughout session about proper posture and technique with exercises. Improved exercise technique, movement at target joints, use of target muscles after min to mod verbal, visual, tactile cues.    Patient demonstrates excellent motivation during session today.  She reports some soreness after last session and attempted left femur posterior to anterior mobilizations again today however patient is only able to tolerate 1 bout due to increase in soreness.  Continued with left hip flexor stretching and added lumbar passive accessory mobilizations due to reported increase in low back soreness after stretching at home. Continued with bilateral lower extremity strengthening in order to improve function and decrease pain.  It is unclear if left hip range of motion deficits are related more to muscle restriction or anatomical bony block. Patient encouraged to follow-up as scheduled and continue HEP.  No modifications made on this date. She will benefit from PT services to address deficits in strength, range of motion, and mobility in order to return to full function at home.                        PT Short Term Goals - 08/28/21 1250       PT SHORT TERM GOAL #1   Title Pt will be independent with HEP in order to improve strength and decrease L hip pain in order to improve pain-free function at home, when painting, and with exercise    Time 4    Period Weeks    Status New    Target Date 09/25/21      PT SHORT TERM GOAL #2   Title Pt will decrease worst L hip pain as reported on NPRS by at least 3 points in order to demonstrate clinically significant reduction in hip  pain.    Baseline 08/28/21: worst: 10/10    Time 8    Period Weeks    Status New    Target Date 09/25/21               PT Long Term Goals - 08/28/21 1252       PT LONG TERM GOAL #1   Title Pt will increase FOTO to at least 58 in order to demonstrate significant improvement in function related to L hip pain    Baseline 08/28/21: 40    Time 8    Period Weeks    Status New    Target Date 10/23/21      PT LONG TERM GOAL #2   Title Pt will decrease worst pain as reported on NPRS to no greater than 4/10 in order to demonstrate clinically significant reduction in pain.  Baseline 08/28/21: worst: 10/10    Time 8    Period Weeks    Status New    Target Date 10/23/21      PT LONG TERM GOAL #3   Title Pt will increase strength of  L hip abduction and extension to at least 4/5 in order to demonstrate improvement in strength and function.    Baseline 08/28/21: L hip abduction: 4-/5, extension: <3/5;    Time 8    Period Weeks    Status New    Target Date 10/23/21                   Plan - 09/13/21 0935     Clinical Impression Statement Patient demonstrates excellent motivation during session today.  She reports some soreness after last session and attempted left femur posterior to anterior mobilizations again today however patient is only able to tolerate 1 bout due to increase in soreness.  Continued with left hip flexor stretching and added lumbar passive accessory mobilizations due to reported increase in low back soreness after stretching at home. Continued with bilateral lower extremity strengthening in order to improve function and decrease pain.  It is unclear if left hip range of motion deficits are related more to muscle restriction or anatomical bony block. Patient encouraged to follow-up as scheduled and continue HEP.  No modifications made on this date. She will benefit from PT services to address deficits in strength, range of motion, and mobility in order to return  to full function at home.    Personal Factors and Comorbidities Age;Comorbidity 1;Time since onset of injury/illness/exacerbation    Comorbidities OA    Examination-Activity Limitations [place]    Examination-Participation Restrictions Community Activity;Occupation    Stability/Clinical Decision Making Stable/Uncomplicated    Rehab Potential Good    PT Frequency 2x / week    PT Duration 8 weeks    PT Treatment/Interventions ADLs/Self Care Home Management;Aquatic Therapy;Biofeedback;Canalith Repostioning;Iontophoresis 4mg /ml Dexamethasone;Moist Heat;Electrical Stimulation;Cryotherapy;Traction;Ultrasound;Gait training;Stair training;Functional mobility training;Therapeutic activities;Therapeutic exercise;Neuromuscular re-education;Manual techniques;Passive range of motion;Dry needling;Vestibular;Spinal Manipulations;Joint Manipulations    PT Next Visit Plan Assess prone positioning, initiate additional stretches and manual techniques for left hip, progress HEP;    PT Home Exercise Plan Prone positioning regularly throughout the day with gradually increasing frequency and duration    Consulted and Agree with Plan of Care Patient             Patient will benefit from skilled therapeutic intervention in order to improve the following deficits and impairments:  Pain, Decreased strength, Decreased range of motion, Impaired flexibility  Visit Diagnosis: Pain in left hip  Muscle weakness (generalized)  Other abnormalities of gait and mobility     Problem List Patient Active Problem List   Diagnosis Date Noted   Primary osteoarthritis of left hip 08/13/2021   Muscular deconditioning 08/13/2021   Spondylosis of lumbosacral region without myelopathy or radiculopathy 08/13/2021   Mixed hyperlipidemia 07/26/2021   Primary osteoarthritis involving multiple joints 06/29/2021   07/01/2021 PT, DPT, GCS  Quantez Schnyder, PT 09/13/2021, 1:01 PM  [facility]  [facility] [address] [place], [place], [place] Phone: [phone]   Fax:  [phone]  Name: KC SEDLAK MRN: Lennox Grumbles Date of Birth: 05/31/1956

## 2021-09-18 ENCOUNTER — Other Ambulatory Visit: Payer: Self-pay

## 2021-09-18 ENCOUNTER — Ambulatory Visit: Payer: Medicare Other

## 2021-09-18 DIAGNOSIS — M25552 Pain in left hip: Secondary | ICD-10-CM

## 2021-09-18 DIAGNOSIS — R2689 Other abnormalities of gait and mobility: Secondary | ICD-10-CM

## 2021-09-18 DIAGNOSIS — M6281 Muscle weakness (generalized): Secondary | ICD-10-CM

## 2021-09-18 NOTE — Therapy (Signed)
[facility] [address] [place], [place], [address] Phone: [phone]   Fax:  [phone]  Physical Therapy Treatment  Patient Details  Name: SCHERYL SANBORN MRN: [id number] Date of Birth: September 04, 1956 Referring Provider (PT): Dr. Ashley Royalty   Encounter Date: 09/18/2021   PT End of Session - 09/18/21 0810     Visit Number 5    Number of Visits 17    Date for PT Re-Evaluation 10/23/21    Authorization Type Medicare    Authorization Time Period 08/28/21-10/23/21    PT Start Time 0800    PT Stop Time 0845    PT Time Calculation (min) 45 min    Activity Tolerance Patient tolerated treatment well;No increased pain    Behavior During Therapy WFL for tasks assessed/performed             Past Medical History:  Diagnosis Date   Arthritis     Past Surgical History:  Procedure Laterality Date   APPENDECTOMY  1980   TUBAL LIGATION Bilateral 1994   UTERINE FIBROID SURGERY  1980    There were no vitals filed for this visit.   Subjective Assessment - 09/18/21 0803     Subjective Pt reports that she is doing well today. She denies any resting hip pain upon arrival but states that when she is walking she has 3/10 soreness deep in her L hip. No specific questions upon arrival.    Pertinent History Pt has a history of chronic low back pain and L hip soreness intermittently for years. When her L hip first started hurting it would occur at night and wake her up. At least 5 years ago the hip pain started to worsen however due to COVID she delayed seeking care. She did see her PCP in [place] who suggested a consult for a L hip replacement however pt would like to avoid surgery. She saw Dr. Ashley Royalty who performed plain film radiographs and per medical record independent interpretation of lumbar x-ray films dated 07/02/2021 revealed overall maintained intervertebral space, anterior endplate osteophytes noted at L5, L4, L3 inferiorly, and L1,  facet hypertrophy noted at the L4-5 articulation. Independent interpretation of left hip X-ray dated 07/02/2021 reveals moderate right and moderate to severe left hip osteoarthritis with subchondral sclerosis, deformation of the femoral head on the left, and acetabular osteophytes, no acute osseous process identified. Based on imaging and physical exam Dr. 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 She was prescribed duloxetine, diclofenac, and the muscle relaxers which have helped with her pain. Pt was referred to PT to focus on attaining maximal range of motion and improving strength. Pt reports that she heavily favors her RLE when standing. 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 She denies and LE weakness, buckling, or paresthesias.    Limitations Sitting;Standing    Diagnostic tests see history    Patient Stated Goals "I want to start walking and lose weight/get in shape" Decrease L hip pain               TREATMENT   Ther-ex NuStep L2-3 x 5 minutes for warm-up and L hip AAROM during interval history (3 minutes unbilled); Standing heel raises 2 x 20; Standing L hip flexor step stretch 2 x 60s;  Prone L  hamstring curls with manual resistance from therapist 2 x 10; Prone L hip extension with straight knee 2 x 10; Hooklying bridges with cues to maintain level pelvis x 10;    Manual Therapy  Prone positioning with moist heat pack under left hip in order to improve extension range of motion while therapist performs CPA L1-L5, grade II-III, 20s/bout x 2 bouts/level; Prone left hip internal and external rotation stretching 2 x 60 seconds in each direction; Prone left quadricep stretch 2 x 60 seconds; Prone left femur posterior to anterior mobilizations to  improve left hip extension, grade 3, performed 30s/bout x 3 bouts;   Pt educated throughout session about proper posture and technique with exercises. Improved exercise technique, movement at target joints, use of target muscles after min to mod verbal, visual, tactile cues.    Patient demonstrates excellent motivation during session today.  She reports some soreness after last session and at home when stretching. Her upright posture does appear improved today and when laying prone she is able to reach neutral L hip extension much easier today. Continued with left hip flexor stretching, lumbar passive accessory mobilizations, and posterior to anterior femur mobilizations to improve extension. Continued with bilateral lower extremity strengthening in order to improve function and decrease pain. Patient encouraged to follow-up as scheduled and continue HEP.  No modifications made on this date. She will benefit from PT services to address deficits in strength, range of motion, and mobility in order to return to full function at home.                        PT Short Term Goals - 08/28/21 1250       PT SHORT TERM GOAL #1   Title Pt will be independent with HEP in order to improve strength and decrease L hip pain in order to improve pain-free function at home, when painting, and with exercise    Time 4    Period Weeks    Status New    Target Date 09/25/21      PT SHORT TERM GOAL #2   Title Pt will decrease worst L hip pain as reported on NPRS by at least 3 points in order to demonstrate clinically significant reduction in hip pain.    Baseline 08/28/21: worst: 10/10    Time 8    Period Weeks    Status New    Target Date 09/25/21               PT Long Term Goals - 08/28/21 1252       PT LONG TERM GOAL #1   Title Pt will increase FOTO to at least 58 in order to demonstrate significant improvement in function related to L hip pain    Baseline 08/28/21: 40    Time 8     Period Weeks    Status New    Target Date 10/23/21      PT LONG TERM GOAL #2   Title Pt will decrease worst pain as reported on NPRS to no greater than 4/10 in order to demonstrate clinically significant reduction in pain.    Baseline 08/28/21: worst: 10/10    Time 8    Period Weeks    Status New    Target Date 10/23/21      PT LONG TERM GOAL #3   Title Pt will increase strength of  L hip abduction and extension to at least 4/5 in order to demonstrate  improvement in strength and function.    Baseline 08/28/21: L hip abduction: 4-/5, extension: <3/5;    Time 8    Period Weeks    Status New    Target Date 10/23/21                   Plan - 09/18/21 5643     Clinical Impression Statement Patient demonstrates excellent motivation during session today.  She reports some soreness after last session and at home when stretching. Her upright posture does appear improved today and when laying prone she is able to reach neutral L hip extension much easier today. Continued with left hip flexor stretching, lumbar passive accessory mobilizations, and posterior to anterior femur mobilizations to improve extension. Continued with bilateral lower extremity strengthening in order to improve function and decrease pain. Patient encouraged to follow-up as scheduled and continue HEP.  No modifications made on this date. She will benefit from PT services to address deficits in strength, range of motion, and mobility in order to return to full function at home.    Personal Factors and Comorbidities Age;Comorbidity 1;Time since onset of injury/illness/exacerbation    Comorbidities OA    Examination-Activity Limitations [place]    Examination-Participation Restrictions Community Activity;Occupation    Stability/Clinical Decision Making Stable/Uncomplicated    Rehab Potential Good    PT Frequency 2x / week    PT Duration 8 weeks    PT Treatment/Interventions ADLs/Self Care Home Management;Aquatic  Therapy;Biofeedback;Canalith Repostioning;Iontophoresis 4mg /ml Dexamethasone;Moist Heat;Electrical Stimulation;Cryotherapy;Traction;Ultrasound;Gait training;Stair training;Functional mobility training;Therapeutic activities;Therapeutic exercise;Neuromuscular re-education;Manual techniques;Passive range of motion;Dry needling;Vestibular;Spinal Manipulations;Joint Manipulations    PT Next Visit Plan Assess prone positioning, initiate additional stretches and manual techniques for left hip, progress HEP;    PT Home Exercise Plan Prone positioning regularly throughout the day with gradually increasing frequency and duration    Consulted and Agree with Plan of Care Patient             Patient will benefit from skilled therapeutic intervention in order to improve the following deficits and impairments:  Pain, Decreased strength, Decreased range of motion, Impaired flexibility  Visit Diagnosis: Pain in left hip  Muscle weakness (generalized)  Other abnormalities of gait and mobility     Problem List Patient Active Problem List   Diagnosis Date Noted   Primary osteoarthritis of left hip 08/13/2021   Muscular deconditioning 08/13/2021   Spondylosis of lumbosacral region without myelopathy or radiculopathy 08/13/2021   Mixed hyperlipidemia 07/26/2021   Primary osteoarthritis involving multiple joints 06/29/2021   07/01/2021 PT, DPT, GCS  Denesia Donelan, PT 09/18/2021, 8:51 AM  [place] [facility] [address] [place], [place], [place] Phone: [phone]   Fax:  [phone]  Name: JODELL WEITMAN MRN: Lennox Grumbles Date of Birth: 08-23-1956

## 2021-09-20 ENCOUNTER — Other Ambulatory Visit: Payer: Self-pay

## 2021-09-20 ENCOUNTER — Ambulatory Visit: Payer: Medicare Other

## 2021-09-20 DIAGNOSIS — M25552 Pain in left hip: Secondary | ICD-10-CM | POA: Diagnosis not present

## 2021-09-20 DIAGNOSIS — M6281 Muscle weakness (generalized): Secondary | ICD-10-CM

## 2021-09-20 NOTE — Therapy (Signed)
[place] [facility] [address] [place], [place], [address] Phone: [phone]   Fax:  [phone]  Physical Therapy Treatment  Patient Details  Name: Courtney Cervantes MRN: [id number] Date of Birth: [birth year] Referring Provider (PT): Dr. Ashley Royalty   Encounter Date: 09/20/2021   PT End of Session - 09/20/21 0937     Visit Number 6    Number of Visits 17    Date for PT Re-Evaluation 10/23/21    Authorization Type Medicare    Authorization Time Period 08/28/21-10/23/21    PT Start Time 0920    PT Stop Time 1005    PT Time Calculation (min) 45 min    Activity Tolerance Patient tolerated treatment well;No increased pain    Behavior During Therapy WFL for tasks assessed/performed             Past Medical History:  Diagnosis Date   Arthritis     Past Surgical History:  Procedure Laterality Date   APPENDECTOMY  1980   TUBAL LIGATION Bilateral 1994   UTERINE FIBROID SURGERY  1980    There were no vitals filed for this visit.   Subjective Assessment - 09/20/21 0936     Subjective Pt reports that she is doing well today. She denies any resting hip pain upon arrival. No specific questions upon arrival.    Pertinent History Pt has a history of chronic low back pain and L hip soreness intermittently for years. When her L hip first started hurting it would occur at night and wake her up. At least 5 years ago the hip pain started to worsen however due to COVID she delayed seeking care. She did see her PCP in [place] who suggested a consult for a L hip replacement however pt would like to avoid surgery. She saw Dr. Ashley Royalty who performed plain film radiographs and per medical record independent interpretation of lumbar x-ray films dated 07/02/2021 revealed overall maintained intervertebral space, anterior endplate osteophytes noted at L5, L4, L3 inferiorly, and L1, facet hypertrophy noted at the L4-5 articulation. Independent interpretation  of left hip X-ray dated 07/02/2021 reveals moderate right and moderate to severe left hip osteoarthritis with subchondral sclerosis, deformation of the femoral head on the left, and acetabular osteophytes, no acute osseous process identified. Based on imaging and physical exam Dr. 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 She was prescribed duloxetine, diclofenac, and the muscle relaxers which have helped with her pain. Pt was referred to PT to focus on attaining maximal range of motion and improving strength. Pt reports that she heavily favors her RLE when standing. 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 She denies and LE weakness, buckling, or paresthesias.    Limitations Sitting;Standing    Diagnostic tests see history    Patient Stated Goals "I want to start walking and lose weight/get in shape" Decrease L hip pain               TREATMENT   Ther-ex NuStep L2-3 x 5 minutes for warm-up and L hip AAROM during interval history (3 minutes unbilled); Standing L hip flexor step stretch 2 x 60s; Prone L hamstring curls with manual resistance from therapist 2 x 10; Prone L hip extension with straight knee 2 x 10; Prone L hip  manually resisted abduction and adduction x 10 each; Hooklying manually resisted clams x 10 BLE; Hooklying manually resisted adductor squeeze x 10 BLE; Hooklying bridges with cues to maintain level pelvis 2 x 10;  HEP updated and reviewed;   Manual Therapy  Prone positioning with moist heat pack under left hip in order to improve extension range of motion  Prone left hip internal and external rotation stretching 2 x 60 seconds in each direction; Prone left quadricep stretch 2 x 60 seconds;   Pt educated throughout session about  proper posture and technique with exercises. Improved exercise technique, movement at target joints, use of target muscles after min to mod verbal, visual, tactile cues.    Patient demonstrates excellent motivation during session today. She states that she has increased her water intake which has helped with her soreness. Her upright posture continues to improve however she continues to lack considerable L hip extension ROM. Continued with left hip flexor stretching as well as LE, back, and abdominal strengthening. HEP updated. Patient encouraged to follow-up as scheduled. She will benefit from PT services to address deficits in strength, range of motion, and mobility in order to return to full function at home.                        PT Short Term Goals - 08/28/21 1250       PT SHORT TERM GOAL #1   Title Pt will be independent with HEP in order to improve strength and decrease L hip pain in order to improve pain-free function at home, when painting, and with exercise    Time 4    Period Weeks    Status New    Target Date 09/25/21      PT SHORT TERM GOAL #2   Title Pt will decrease worst L hip pain as reported on NPRS by at least 3 points in order to demonstrate clinically significant reduction in hip pain.    Baseline 08/28/21: worst: 10/10    Time 8    Period Weeks    Status New    Target Date 09/25/21               PT Long Term Goals - 08/28/21 1252       PT LONG TERM GOAL #1   Title Pt will increase FOTO to at least 58 in order to demonstrate significant improvement in function related to L hip pain    Baseline 08/28/21: 40    Time 8    Period Weeks    Status New    Target Date 10/23/21      PT LONG TERM GOAL #2   Title Pt will decrease worst pain as reported on NPRS to no greater than 4/10 in order to demonstrate clinically significant reduction in pain.    Baseline 08/28/21: worst: 10/10    Time 8    Period Weeks    Status New    Target Date  10/23/21      PT LONG TERM GOAL #3   Title Pt will increase strength of  L hip abduction and extension to at least 4/5 in order to demonstrate improvement in strength and function.    Baseline 08/28/21: L hip abduction: 4-/5, extension: <3/5;    Time 8    Period Weeks    Status New    Target Date 10/23/21  Plan - 09/20/21 0937     Clinical Impression Statement Patient demonstrates excellent motivation during session today. She states that she has increased her water intake which has helped with her soreness. Her upright posture continues to improve however she continues to lack considerable L hip extension ROM. Continued with left hip flexor stretching as well as LE, back, and abdominal strengthening. HEP updated. Patient encouraged to follow-up as scheduled. She will benefit from PT services to address deficits in strength, range of motion, and mobility in order to return to full function at home.    Personal Factors and Comorbidities Age;Comorbidity 1;Time since onset of injury/illness/exacerbation    Comorbidities OA    Examination-Activity Limitations [place]    Examination-Participation Restrictions Community Activity;Occupation    Stability/Clinical Decision Making Stable/Uncomplicated    Rehab Potential Good    PT Frequency 2x / week    PT Duration 8 weeks    PT Treatment/Interventions ADLs/Self Care Home Management;Aquatic Therapy;Biofeedback;Canalith Repostioning;Iontophoresis 4mg /ml Dexamethasone;Moist Heat;Electrical Stimulation;Cryotherapy;Traction;Ultrasound;Gait training;Stair training;Functional mobility training;Therapeutic activities;Therapeutic exercise;Neuromuscular re-education;Manual techniques;Passive range of motion;Dry needling;Vestibular;Spinal Manipulations;Joint Manipulations    PT Next Visit Plan Assess prone positioning, initiate additional stretches and manual techniques for left hip, progress HEP;    PT Home Exercise Plan Access Code:  [email or web address], Prone positioning regularly throughout the day with gradually increasing frequency and duration    Consulted and Agree with Plan of Care Patient             Patient will benefit from skilled therapeutic intervention in order to improve the following deficits and impairments:  Pain, Decreased strength, Decreased range of motion, Impaired flexibility  Visit Diagnosis: Pain in left hip  Muscle weakness (generalized)     Problem List Patient Active Problem List   Diagnosis Date Noted   Primary osteoarthritis of left hip 08/13/2021   Muscular deconditioning 08/13/2021   Spondylosis of lumbosacral region without myelopathy or radiculopathy 08/13/2021   Mixed hyperlipidemia 07/26/2021   Primary osteoarthritis involving multiple joints 06/29/2021   07/01/2021 PT, DPT, GCS  Sarh Kirschenbaum, PT 09/20/2021, 10:40 AM  [facility] [address] [place], [place], [place] Phone: [phone]   Fax:  [phone]  Name: KATHRINA CROSLEY MRN: Lennox Grumbles Date of Birth: June 12, 1956

## 2021-09-20 NOTE — Patient Instructions (Signed)
Access Code: [email or web address] URL: [email or web address] Date: 09/20/2021 Prepared by: Ria Comment  Exercises Supine Bridge - 1 x daily - 7 x weekly - 2 sets - 10 reps Hooklying Clamshell with Resistance - 1 x daily - 7 x weekly - 2 sets - 10 reps Lying Prone - 3 x daily - 7 x weekly - 5 minutes hold Seated Figure 4 Piriformis Stretch - 3 x daily - 7 x weekly - 3 reps - 60s hold Hip Flexor Stretch on Step (Mirrored) - 3 x daily - 7 x weekly - 3 reps - 60s hold

## 2021-09-27 ENCOUNTER — Ambulatory Visit: Payer: Medicare Other

## 2021-09-27 ENCOUNTER — Other Ambulatory Visit: Payer: Self-pay

## 2021-09-27 DIAGNOSIS — M6281 Muscle weakness (generalized): Secondary | ICD-10-CM

## 2021-09-27 DIAGNOSIS — M25552 Pain in left hip: Secondary | ICD-10-CM

## 2021-09-27 NOTE — Therapy (Signed)
[place] [facility] [address] [place], [place], [address] Phone: [phone]   Fax:  [phone]  Physical Therapy Treatment  Patient Details  Name: Courtney Cervantes MRN: [id number] Date of Birth: 1956/02/21 Referring Provider (PT): Dr. Ashley Royalty   Encounter Date: 09/27/2021   PT End of Session - 09/27/21 0851     Visit Number 7    Number of Visits 17    Date for PT Re-Evaluation 10/23/21    Authorization Type Medicare    Authorization Time Period 08/28/21-10/23/21    PT Start Time 0845    PT Stop Time 0930    PT Time Calculation (min) 45 min    Activity Tolerance Patient tolerated treatment well    Behavior During Therapy [facility] for tasks assessed/performed             Past Medical History:  Diagnosis Date   Arthritis     Past Surgical History:  Procedure Laterality Date   APPENDECTOMY  1980   TUBAL LIGATION Bilateral 1994   UTERINE FIBROID SURGERY  1980    There were no vitals filed for this visit.   Subjective Assessment - 09/27/21 0850     Subjective Pt reports that she is doing well today. Less soreness today compared to prior session. She reports 1/10 resting L hip pain. No specific questions upon arrival.    Pertinent History Pt has a history of chronic low back pain and L hip soreness intermittently for years. When her L hip first started hurting it would occur at night and wake her up. At least 5 years ago the hip pain started to worsen however due to COVID she delayed seeking care. She did see her PCP in [place] who suggested a consult for a L hip replacement however pt would like to avoid surgery. She saw Dr. Ashley Royalty who performed plain film radiographs and per medical record independent interpretation of lumbar x-ray films dated 07/02/2021 revealed overall maintained intervertebral space, anterior endplate osteophytes noted at L5, L4, L3 inferiorly, and L1, facet hypertrophy noted at the L4-5 articulation.  Independent interpretation of left hip X-ray dated 07/02/2021 reveals moderate right and moderate to severe left hip osteoarthritis with subchondral sclerosis, deformation of the femoral head on the left, and acetabular osteophytes, no acute osseous process identified. Based on imaging and physical exam Dr. 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 She was prescribed duloxetine, diclofenac, and the muscle relaxers which have helped with her pain. Pt was referred to PT to focus on attaining maximal range of motion and improving strength. Pt reports that she heavily favors her RLE when standing. 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 She denies and LE weakness, buckling, or paresthesias.    Limitations Sitting;Standing    Diagnostic tests see history    Patient Stated Goals "I want to start walking and lose weight/get in shape" Decrease L hip pain               TREATMENT   Ther-ex NuStep L2-3 x 5 minutes for warm-up and L hip AAROM during interval history (3 minutes unbilled); Standing L hip flexor step stretch 2 x 60s; Seated L HS step stretch 2 x 60s; Prone L hamstring curls with manual resistance from therapist 2 x 10; Prone  L hip extension with straight knee 2 x 10; Hooklying frog stretch x 2 minutes with 3# ankle weight on L knee to help assist L hip into ER; Hooklying manually resisted clams x 10 BLE; Hooklying manually resisted adductor squeeze x 10 BLE; Hooklying bridges with cues to maintain level pelvis x 10;    Manual Therapy  Prone positioning with moist heat pack under left hip in order to improve extension range of motion  Prone left hip internal and external rotation stretching 2 x 60 seconds in each direction; Prone left quadricep stretch  2 x 60 seconds; Prone posterior to anterior femur on pelvis mobilizations, grade II-III, 30s/bout x 2  bouts;   Pt educated throughout session about proper posture and technique with exercises. Improved exercise technique, movement at target joints, use of target muscles after min to mod verbal, visual, tactile cues.    Patient demonstrates excellent motivation during session today. She reports less soreness upon arrival today. Her upright posture continues to improve however she continues to lack considerable L hip extension ROM. Continued with left hip flexor stretching as well as LE, back, and abdominal strengthening. Patient encouraged to continue HEP and follow-up as scheduled. She will benefit from PT services to address deficits in strength, range of motion, and mobility in order to return to full function at home.                        PT Short Term Goals - 08/28/21 1250       PT SHORT TERM GOAL #1   Title Pt will be independent with HEP in order to improve strength and decrease L hip pain in order to improve pain-free function at home, when painting, and with exercise    Time 4    Period Weeks    Status New    Target Date 09/25/21      PT SHORT TERM GOAL #2   Title Pt will decrease worst L hip pain as reported on NPRS by at least 3 points in order to demonstrate clinically significant reduction in hip pain.    Baseline 08/28/21: worst: 10/10    Time 8    Period Weeks    Status New    Target Date 09/25/21               PT Long Term Goals - 08/28/21 1252       PT LONG TERM GOAL #1   Title Pt will increase FOTO to at least 58 in order to demonstrate significant improvement in function related to L hip pain    Baseline 08/28/21: 40    Time 8    Period Weeks    Status New    Target Date 10/23/21      PT LONG TERM GOAL #2   Title Pt will decrease worst pain as reported on NPRS to no greater than 4/10 in order to demonstrate clinically significant  reduction in pain.    Baseline 08/28/21: worst: 10/10    Time 8    Period Weeks    Status New    Target Date 10/23/21      PT LONG TERM GOAL #3   Title Pt will increase strength of  L hip abduction and extension to at least 4/5 in order to demonstrate improvement in strength and function.    Baseline 08/28/21: L hip abduction: 4-/5, extension: <3/5;    Time 8    Period Weeks  Status New    Target Date 10/23/21                   Plan - 09/27/21 0851     Clinical Impression Statement Patient demonstrates excellent motivation during session today. She reports less soreness upon arrival today. Her upright posture continues to improve however she continues to lack considerable L hip extension ROM. Continued with left hip flexor stretching as well as LE, back, and abdominal strengthening. Patient encouraged to continue HEP and follow-up as scheduled. She will benefit from PT services to address deficits in strength, range of motion, and mobility in order to return to full function at home.    Personal Factors and Comorbidities Age;Comorbidity 1;Time since onset of injury/illness/exacerbation    Comorbidities OA    Examination-Activity Limitations [place]    Examination-Participation Restrictions Community Activity;Occupation    Stability/Clinical Decision Making Stable/Uncomplicated    Rehab Potential Good    PT Frequency 2x / week    PT Duration 8 weeks    PT Treatment/Interventions ADLs/Self Care Home Management;Aquatic Therapy;Biofeedback;Canalith Repostioning;Iontophoresis 4mg /ml Dexamethasone;Moist Heat;Electrical Stimulation;Cryotherapy;Traction;Ultrasound;Gait training;Stair training;Functional mobility training;Therapeutic activities;Therapeutic exercise;Neuromuscular re-education;Manual techniques;Passive range of motion;Dry needling;Vestibular;Spinal Manipulations;Joint Manipulations    PT Next Visit Plan Assess prone positioning, initiate additional stretches and manual  techniques for left hip, progress HEP;    PT Home Exercise Plan Access Code: [email or web address], Prone positioning regularly throughout the day with gradually increasing frequency and duration    Consulted and Agree with Plan of Care Patient             Patient will benefit from skilled therapeutic intervention in order to improve the following deficits and impairments:  Pain, Decreased strength, Decreased range of motion, Impaired flexibility  Visit Diagnosis: Pain in left hip  Muscle weakness (generalized)     Problem List Patient Active Problem List   Diagnosis Date Noted   Primary osteoarthritis of left hip 08/13/2021   Muscular deconditioning 08/13/2021   Spondylosis of lumbosacral region without myelopathy or radiculopathy 08/13/2021   Mixed hyperlipidemia 07/26/2021   Primary osteoarthritis involving multiple joints 06/29/2021   07/01/2021 PT, DPT, GCS  Shaquella Stamant, PT 09/27/2021, 9:36 AM  [place] [facility] [address] [place], [place], [place] Phone: [phone]   Fax:  [phone]  Name: Courtney Cervantes MRN: Lennox Grumbles Date of Birth: Jan 05, 1956

## 2021-10-04 ENCOUNTER — Ambulatory Visit: Payer: Medicare Other | Attending: Family Medicine

## 2021-10-04 ENCOUNTER — Other Ambulatory Visit: Payer: Self-pay

## 2021-10-04 DIAGNOSIS — G8929 Other chronic pain: Secondary | ICD-10-CM | POA: Diagnosis present

## 2021-10-04 DIAGNOSIS — R2689 Other abnormalities of gait and mobility: Secondary | ICD-10-CM | POA: Insufficient documentation

## 2021-10-04 DIAGNOSIS — M25552 Pain in left hip: Secondary | ICD-10-CM | POA: Insufficient documentation

## 2021-10-04 DIAGNOSIS — M25512 Pain in left shoulder: Secondary | ICD-10-CM | POA: Insufficient documentation

## 2021-10-04 DIAGNOSIS — M6281 Muscle weakness (generalized): Secondary | ICD-10-CM | POA: Diagnosis not present

## 2021-10-04 NOTE — Therapy (Signed)
[place] [facility] [address] [place], [place], [address] Phone: [phone]   Fax:  [phone]  Physical Therapy Treatment  Patient Details  Name: Courtney Cervantes MRN: [id number] Date of Birth: 08-17-1956 Referring Provider (PT): Dr. Ashley Royalty   Encounter Date: 10/04/2021   PT End of Session - 10/04/21 1331     Visit Number 8    Number of Visits 17    Date for PT Re-Evaluation 10/23/21    Authorization Type Medicare    Authorization Time Period 08/28/21-10/23/21    PT Start Time 0800    PT Stop Time 0845    PT Time Calculation (min) 45 min    Activity Tolerance Patient tolerated treatment well    Behavior During Therapy [facility] for tasks assessed/performed             Past Medical History:  Diagnosis Date   Arthritis     Past Surgical History:  Procedure Laterality Date   APPENDECTOMY  1980   TUBAL LIGATION Bilateral 1994   UTERINE FIBROID SURGERY  1980    There were no vitals filed for this visit.   Subjective Assessment - 10/04/21 0829     Subjective Pt reports that she is doing well today. She continues with some intermittent L hip soreness after stretching but denies any L hip pain upon arrival today. No specific questions upon arrival.    Pertinent History Pt has a history of chronic low back pain and L hip soreness intermittently for years. When her L hip first started hurting it would occur at night and wake her up. At least 5 years ago the hip pain started to worsen however due to COVID she delayed seeking care. She did see her PCP in [place] who suggested a consult for a L hip replacement however pt would like to avoid surgery. She saw Dr. Ashley Royalty who performed plain film radiographs and per medical record independent interpretation of lumbar x-ray films dated 07/02/2021 revealed overall maintained intervertebral space, anterior endplate osteophytes noted at L5, L4, L3 inferiorly, and L1, facet hypertrophy noted at  the L4-5 articulation. Independent interpretation of left hip X-ray dated 07/02/2021 reveals moderate right and moderate to severe left hip osteoarthritis with subchondral sclerosis, deformation of the femoral head on the left, and acetabular osteophytes, no acute osseous process identified. Based on imaging and physical exam Dr. 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 She was prescribed duloxetine, diclofenac, and the muscle relaxers which have helped with her pain. Pt was referred to PT to focus on attaining maximal range of motion and improving strength. Pt reports that she heavily favors her RLE when standing. 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 She denies and LE weakness, buckling, or paresthesias.    Limitations Sitting;Standing    Diagnostic tests see history    Patient Stated Goals "I want to start walking and lose weight/get in shape" Decrease L hip pain               TREATMENT   Ther-ex NuStep L2-3 x 5 minutes for warm-up and L hip AAROM during interval history (3 minutes unbilled); Prone L hip extension with straight knee 2 x 10; Hooklying manually resisted clams 2 x 10 BLE; Hooklying manually resisted adductor squeeze 2 x  10 BLE; Hooklying bridges with cues to maintain level pelvis 2 x 10;    Manual Therapy  Prone positioning with moist heat pack under left hip in order to improve extension range of motion, eventually removed heat pack to increase L hip extension;  Prone left hip internal and external rotation stretching 2 x 60 seconds in each direction; Prone posterior to anterior femur on pelvis mobilizations, grade II-III, 30s/bout x 4  bouts; Prone L2-L5 grade III mobilizations, 20s/bout x 1 bout/level; Supine L hip flexor stretch  off edge of table with cues to prevent lumbar extension and manual pelvis block provided by therapist 2 x 30 seconds;   Pt educated throughout session about proper posture and technique with exercises. Improved exercise technique, movement at target joints, use of target muscles after min to mod verbal, visual, tactile cues.    Patient demonstrates excellent motivation during session today.  She continues to ambulate with significant left hip flexion posturing. Continued with left hip flexor stretching as well as LE, back, and abdominal strengthening. Patient encouraged to continue HEP and follow-up as scheduled. She will benefit from PT services to address deficits in strength, range of motion, and mobility in order to return to full function at home.                        PT Short Term Goals - 08/28/21 1250       PT SHORT TERM GOAL #1   Title Pt will be independent with HEP in order to improve strength and decrease L hip pain in order to improve pain-free function at home, when painting, and with exercise    Time 4    Period Weeks    Status New    Target Date 09/25/21      PT SHORT TERM GOAL #2   Title Pt will decrease worst L hip pain as reported on NPRS by at least 3 points in order to demonstrate clinically significant reduction in hip pain.    Baseline 08/28/21: worst: 10/10    Time 8    Period Weeks    Status New    Target Date 09/25/21               PT Long Term Goals - 08/28/21 1252       PT LONG TERM GOAL #1   Title Pt will increase FOTO to at least 58 in order to demonstrate significant improvement in function related to L hip pain    Baseline 08/28/21: 40    Time 8    Period Weeks    Status New    Target Date 10/23/21      PT LONG TERM GOAL #2   Title Pt will decrease worst pain as reported on NPRS to no greater than 4/10 in order to demonstrate clinically significant reduction in pain.    Baseline 08/28/21: worst: 10/10    Time 8     Period Weeks    Status New    Target Date 10/23/21      PT LONG TERM GOAL #3   Title Pt will increase strength of  L hip abduction and extension to at least 4/5 in order to demonstrate improvement in strength and function.    Baseline 08/28/21: L hip abduction: 4-/5, extension: <3/5;    Time 8    Period Weeks    Status New    Target Date 10/23/21  Plan - 10/04/21 1332     Clinical Impression Statement Patient demonstrates excellent motivation during session today.  She continues to ambulate with significant left hip flexion posturing. Continued with left hip flexor stretching as well as LE, back, and abdominal strengthening. Patient encouraged to continue HEP and follow-up as scheduled. She will benefit from PT services to address deficits in strength, range of motion, and mobility in order to return to full function at home.    Personal Factors and Comorbidities Age;Comorbidity 1;Time since onset of injury/illness/exacerbation    Comorbidities OA    Examination-Activity Limitations [place]    Examination-Participation Restrictions Community Activity;Occupation    Stability/Clinical Decision Making Stable/Uncomplicated    Rehab Potential Good    PT Frequency 2x / week    PT Duration 8 weeks    PT Treatment/Interventions ADLs/Self Care Home Management;Aquatic Therapy;Biofeedback;Canalith Repostioning;Iontophoresis 4mg /ml Dexamethasone;Moist Heat;Electrical Stimulation;Cryotherapy;Traction;Ultrasound;Gait training;Stair training;Functional mobility training;Therapeutic activities;Therapeutic exercise;Neuromuscular re-education;Manual techniques;Passive range of motion;Dry needling;Vestibular;Spinal Manipulations;Joint Manipulations    PT Next Visit Plan Assess prone positioning, initiate additional stretches and manual techniques for left hip, progress HEP;    PT Home Exercise Plan Access Code: [email or web address], Prone positioning regularly throughout the day with gradually  increasing frequency and duration    Consulted and Agree with Plan of Care Patient             Patient will benefit from skilled therapeutic intervention in order to improve the following deficits and impairments:  Pain, Decreased strength, Decreased range of motion, Impaired flexibility  Visit Diagnosis: Muscle weakness (generalized)  Chronic left shoulder pain     Problem List Patient Active Problem List   Diagnosis Date Noted   Primary osteoarthritis of left hip 08/13/2021   Muscular deconditioning 08/13/2021   Spondylosis of lumbosacral region without myelopathy or radiculopathy 08/13/2021   Mixed hyperlipidemia 07/26/2021   Primary osteoarthritis involving multiple joints 06/29/2021   07/01/2021 PT, DPT, GCS  Courtney Cervantes, PT 10/04/2021, 1:49 PM  [place] [facility] [address] [place], [place], [place] Phone: [phone]   Fax:  [phone]  Name: Courtney Cervantes MRN: Lennox Grumbles Date of Birth: Nov 25, 1956

## 2021-10-09 ENCOUNTER — Ambulatory Visit: Payer: Medicare Other

## 2021-10-09 ENCOUNTER — Other Ambulatory Visit: Payer: Self-pay

## 2021-10-09 DIAGNOSIS — M6281 Muscle weakness (generalized): Secondary | ICD-10-CM | POA: Diagnosis not present

## 2021-10-09 DIAGNOSIS — M25552 Pain in left hip: Secondary | ICD-10-CM

## 2021-10-09 NOTE — Therapy (Signed)
[place] [facility] [address] [place], [place], [address] Phone: [phone]   Fax:  [phone]  Physical Therapy Treatment  Patient Details  Name: Courtney Cervantes MRN: [id number] Date of Birth: 08-13-1956 Referring Provider (PT): Dr. Ashley Royalty   Encounter Date: 10/09/2021   PT End of Session - 10/09/21 0803     Visit Number 9    Number of Visits 17    Date for PT Re-Evaluation 10/23/21    Authorization Type Medicare    Authorization Time Period 08/28/21-10/23/21    PT Start Time 0800    PT Stop Time 0845    PT Time Calculation (min) 45 min    Activity Tolerance Patient tolerated treatment well    Behavior During Therapy [facility] for tasks assessed/performed             Past Medical History:  Diagnosis Date   Arthritis     Past Surgical History:  Procedure Laterality Date   APPENDECTOMY  1980   TUBAL LIGATION Bilateral 1994   UTERINE FIBROID SURGERY  1980    There were no vitals filed for this visit.   Subjective Assessment - 10/09/21 0801     Subjective Pt reports that she is doing well today. Denies any L hip pain upon arrival today. States that she didn't realize that she stopped taking the diclofenac a couple weeks ago. She started taking it again since the last visit and has noticed an improvement in her soreness/pain.  No specific questions currently.    Pertinent History Pt has a history of chronic low back pain and L hip soreness intermittently for years. When her L hip first started hurting it would occur at night and wake her up. At least 5 years ago the hip pain started to worsen however due to COVID she delayed seeking care. She did see her PCP in [place] who suggested a consult for a L hip replacement however pt would like to avoid surgery. She saw Dr. Ashley Royalty who performed plain film radiographs and per medical record independent interpretation of lumbar x-ray films dated 07/02/2021 revealed overall maintained  intervertebral space, anterior endplate osteophytes noted at L5, L4, L3 inferiorly, and L1, facet hypertrophy noted at the L4-5 articulation. Independent interpretation of left hip X-ray dated 07/02/2021 reveals moderate right and moderate to severe left hip osteoarthritis with subchondral sclerosis, deformation of the femoral head on the left, and acetabular osteophytes, no acute osseous process identified. Based on imaging and physical exam Dr. 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 She was prescribed duloxetine, diclofenac, and the muscle relaxers which have helped with her pain. Pt was referred to PT to focus on attaining maximal range of motion and improving strength. Pt reports that she heavily favors her RLE when standing. 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 She denies and LE weakness, buckling, or paresthesias.    Limitations Sitting;Standing    Diagnostic tests see history    Patient Stated Goals "I want to start walking and lose weight/get in shape" Decrease L hip pain    Currently in Pain? No/denies               TREATMENT   Ther-ex NuStep L2-3 x 5 minutes for warm-up and L hip AAROM during interval history (  3 minutes unbilled); Prone L hip extension with straight knee 2 x 10; Hooklying manually resisted clams 2 x 10 BLE; Hooklying manually resisted adductor squeeze 2 x 10 BLE; Hooklying bridges with cues to maintain level pelvis 2 x 10;    Manual Therapy  Prone positioning with moist heat pack under left hip in order to improve extension range of motion, eventually removed heat pack to increase L hip extension and finally utilized 3" 1/2 foam roll under distal thigh;  Prone left hip internal and external rotation as well as quad  stretching 2 x 60 seconds each; Prone posterior to anterior femur on pelvis mobilizations, grade II-III, 30s/bout x 3  bouts; Prone L2-L5 grade III mobilizations, 20s/bout x 1 bout/level; Hooklying left hip belt assisted medial to lateral mobilizations, grade III, 30 seconds per bout x 3 bouts; Supine L hip flexor stretch off edge of table with cues to prevent lumbar extension and manual pelvis block provided by therapist x 30 seconds;   Pt educated throughout session about proper posture and technique with exercises. Improved exercise technique, movement at target joints, use of target muscles after min to mod verbal, visual, tactile cues.    Patient demonstrates excellent motivation during session today.  She continues to ambulate with significant left hip flexion posturing however at the end of session demonstrates improved upright standing posture with cueing.  Continued with left hip flexor stretching as well as LE, back, and abdominal strengthening.  Introduced left hip belt assisted medial lateral mobilizations to improve with hip rotation.  She will need updated outcome measures, goals, and a progress note at next visit.  Patient encouraged to continue HEP and follow-up as scheduled. She will benefit from PT services to address deficits in strength, range of motion, and mobility in order to return to full function at home.                        PT Short Term Goals - 08/28/21 1250       PT SHORT TERM GOAL #1   Title Pt will be independent with HEP in order to improve strength and decrease L hip pain in order to improve pain-free function at home, when painting, and with exercise    Time 4    Period Weeks    Status New    Target Date 09/25/21      PT SHORT TERM GOAL #2   Title Pt will decrease worst L hip pain as reported on NPRS by at least 3 points in order to demonstrate clinically significant reduction in hip pain.    Baseline 08/28/21: worst: 10/10    Time 8     Period Weeks    Status New    Target Date 09/25/21               PT Long Term Goals - 08/28/21 1252       PT LONG TERM GOAL #1   Title Pt will increase FOTO to at least 58 in order to demonstrate significant improvement in function related to L hip pain    Baseline 08/28/21: 40    Time 8    Period Weeks    Status New    Target Date 10/23/21      PT LONG TERM GOAL #2   Title Pt will decrease worst pain as reported on NPRS to no greater than 4/10 in order to demonstrate clinically significant reduction in pain.    Baseline 08/28/21:  worst: 10/10    Time 8    Period Weeks    Status New    Target Date 10/23/21      PT LONG TERM GOAL #3   Title Pt will increase strength of  L hip abduction and extension to at least 4/5 in order to demonstrate improvement in strength and function.    Baseline 08/28/21: L hip abduction: 4-/5, extension: <3/5;    Time 8    Period Weeks    Status New    Target Date 10/23/21                   Plan - 10/09/21 0804     Clinical Impression Statement Patient demonstrates excellent motivation during session today.  She continues to ambulate with significant left hip flexion posturing however at the end of session demonstrates improved upright standing posture with cueing.  Continued with left hip flexor stretching as well as LE, back, and abdominal strengthening.  Introduced left hip belt assisted medial lateral mobilizations to improve with hip rotation.  She will need updated outcome measures, goals, and a progress note at next visit.  Patient encouraged to continue HEP and follow-up as scheduled. She will benefit from PT services to address deficits in strength, range of motion, and mobility in order to return to full function at home.    Personal Factors and Comorbidities Age;Comorbidity 1;Time since onset of injury/illness/exacerbation    Comorbidities OA    Examination-Activity Limitations [place]    Examination-Participation Restrictions  Community Activity;Occupation    Stability/Clinical Decision Making Stable/Uncomplicated    Rehab Potential Good    PT Frequency 2x / week    PT Duration 8 weeks    PT Treatment/Interventions ADLs/Self Care Home Management;Aquatic Therapy;Biofeedback;Canalith Repostioning;Iontophoresis 4mg /ml Dexamethasone;Moist Heat;Electrical Stimulation;Cryotherapy;Traction;Ultrasound;Gait training;Stair training;Functional mobility training;Therapeutic activities;Therapeutic exercise;Neuromuscular re-education;Manual techniques;Passive range of motion;Dry needling;Vestibular;Spinal Manipulations;Joint Manipulations    PT Next Visit Plan Update outcome measures, goals, progress note, continue with manual techniques and stretching, progress HEP;    PT Home Exercise Plan Access Code: [email or web address], Prone positioning regularly throughout the day with gradually increasing frequency and duration    Consulted and Agree with Plan of Care Patient             Patient will benefit from skilled therapeutic intervention in order to improve the following deficits and impairments:  Pain, Decreased strength, Decreased range of motion, Impaired flexibility  Visit Diagnosis: Muscle weakness (generalized)  Pain in left hip     Problem List Patient Active Problem List   Diagnosis Date Noted   Primary osteoarthritis of left hip 08/13/2021   Muscular deconditioning 08/13/2021   Spondylosis of lumbosacral region without myelopathy or radiculopathy 08/13/2021   Mixed hyperlipidemia 07/26/2021   Primary osteoarthritis involving multiple joints 06/29/2021   07/01/2021 PT, DPT, GCS  Petar Mucci, PT 10/09/2021, 1:27 PM   [facility] [address] [place], [place], [place] Phone: [phone]   Fax:  [phone]  Name: Courtney Cervantes MRN: Lennox Grumbles Date of Birth: [birth year]

## 2021-10-11 ENCOUNTER — Other Ambulatory Visit: Payer: Self-pay

## 2021-10-11 ENCOUNTER — Ambulatory Visit: Payer: Medicare Other

## 2021-10-11 DIAGNOSIS — M6281 Muscle weakness (generalized): Secondary | ICD-10-CM | POA: Diagnosis not present

## 2021-10-11 DIAGNOSIS — M25552 Pain in left hip: Secondary | ICD-10-CM

## 2021-10-11 NOTE — Therapy (Signed)
[facility] [address] [place], [place], [address] Phone: [phone]   Fax:  [phone]  Physical Therapy Progress Note  Dates of reporting period  08/28/21   to   10/11/21  Patient Details  Name: Courtney Cervantes MRN: [id number] Date of Birth: [birth year] Referring Provider (PT): Dr. Zigmund Daniel   Encounter Date: 10/11/2021   PT End of Session - 10/11/21 0757     Visit Number 10    Number of Visits 17    Date for PT Re-Evaluation 10/23/21    Authorization Type Medicare    Authorization Time Period 08/28/21-10/23/21    PT Start Time 0800    PT Stop Time 0845    PT Time Calculation (min) 45 min    Activity Tolerance Patient tolerated treatment well    Behavior During Therapy [facility] for tasks assessed/performed             Past Medical History:  Diagnosis Date   Arthritis     Past Surgical History:  Procedure Laterality Date   [place] Bilateral Plain    There were no vitals filed for this visit.   Subjective Assessment - 10/11/21 0757     Subjective Pt reports that she is doing well today. Denies any L hip pain upon arrival today. She has been working on improving her standing posture at home with respect to her L hip.  No specific questions currently.    Pertinent History Pt has a history of chronic low back pain and L hip soreness intermittently for years. When her L hip first started hurting it would occur at night and wake her up. At least 5 years ago the hip pain started to worsen however due to [place] she delayed seeking care. She did see her PCP in [place] who suggested a consult for a L hip replacement however pt would like to avoid surgery. She saw Dr. Zigmund Daniel who performed plain film radiographs and per medical record independent interpretation of lumbar x-ray films dated 07/02/2021 revealed overall maintained intervertebral space, anterior endplate  osteophytes noted at L5, L4, L3 inferiorly, and L1, facet hypertrophy noted at the L4-5 articulation. Independent interpretation of left hip X-ray dated 07/02/2021 reveals moderate right and moderate to severe left hip osteoarthritis with subchondral sclerosis, deformation of the femoral head on the left, and acetabular osteophytes, no acute osseous process identified. Based on imaging and physical exam Dr. 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 She was prescribed duloxetine, diclofenac, and the muscle relaxers which have helped with her pain. Pt was referred to PT to focus on attaining maximal range of motion and improving strength. Pt reports that she heavily favors her RLE when standing. 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 She denies and LE weakness, buckling, or paresthesias.    Limitations Sitting;Standing    Diagnostic tests see history    Patient Stated Goals "I want to start walking and lose weight/get in shape" Decrease L hip pain    Currently in Pain? No/denies               TREATMENT   Ther-ex NuStep L2-3 x 5 minutes for warm-up and L hip AAROM during interval history (3  minutes unbilled);  AROM (degrees) Hip IR (0-45): L: 30 Hip ER (0-45): L: 20 Hip Extension: 0 *Indicates pain  Strength MMT: L hip abduction: 4-/5, extension: 3+/5;  Prone L hip extension with straight knee 2 x 10; Reviewed plan of care;   Manual Therapy  Prone positioning with moist heat pack under left hip in order to improve extension range of motion, utilizing 3" 1/2 foam roll under distal thigh;  Prone left hip internal and external rotation as well as quad stretching 2 x 60 seconds each; Prone posterior to anterior femur on pelvis mobilizations, grade II-III,  30s/bout x 3  bouts; Prone L2-L5 grade III mobilizations, 20s/bout x 1 bout/level;   Pt educated throughout session about proper posture and technique with exercises. Improved exercise technique, movement at target joints, use of target muscles after min to mod verbal, visual, tactile cues.    Patient demonstrates excellent motivation during session today.  Updated outcome measures and goals with patient during session today. She demonstrates significant improvement in L hip range of motion especially extension. She is now able to reach neutral L hip extension fairly easily compared to when she started therapy when she couldn't get anywhere close to neutral hip extension. Internal and external rotation have also improved significantly. Her worst pain has improved from 10/10 at initial evaluation to 3/10 over the last week. Her L hip extension strength has improved however she continues to demonstrate deficits in L hip extension and abduction strength. Her FOTO score improved from 40 at initial evaluation to 60 today. She continues to ambulate with left hip flexion posturing however it is notably improved from the initial evaluation. Continued with left hip flexor stretching as well as LE, back, and abdominal strengthening. Patient encouraged to continue HEP and follow-up as scheduled. She will benefit from PT services to address deficits in strength, range of motion, and mobility in order to return to full function at home.                        PT Short Term Goals - 10/11/21 0820       PT SHORT TERM GOAL #1   Title Pt will be independent with HEP in order to improve strength and decrease L hip pain in order to improve pain-free function at home, when painting, and with exercise    Time 4    Period Weeks    Status Partially Met    Target Date 09/25/21      PT SHORT TERM GOAL #2   Title Pt will decrease worst L hip pain as reported on NPRS by at least 3 points in order to  demonstrate clinically significant reduction in hip pain.    Baseline 08/28/21: worst: 10/10; 10/11/21: 3/10;    Time 8    Period Weeks    Status Achieved    Target Date --               PT Long Term Goals - 10/11/21 0822       PT LONG TERM GOAL #1   Title Pt will increase FOTO to at least 58 in order to demonstrate significant improvement in function related to L hip pain    Baseline 08/28/21: 40; 10/11/21: 60    Time 8    Period Weeks    Status Achieved    Target Date --      PT LONG TERM GOAL #2   Title Pt will decrease worst pain  as reported on NPRS to no greater than 4/10 in order to demonstrate clinically significant reduction in pain.    Baseline 08/28/21: worst: 10/10; 10/11/21; 3/10;    Time 8    Period Weeks    Status Achieved      PT LONG TERM GOAL #3   Title Pt will increase strength of  L hip abduction and extension to at least 4/5 in order to demonstrate improvement in strength and function.    Baseline 08/28/21: L hip abduction: 4-/5, extension: <3/5; 10/11/21: L hip abduction: 4-/5, extension: 3+/5;    Time 8    Period Weeks    Status Partially Met                   Plan - 10/11/21 0758     Clinical Impression Statement Patient demonstrates excellent motivation during session today.  Updated outcome measures and goals with patient during session today. She demonstrates significant improvement in L hip range of motion especially extension. She is now able to reach neutral L hip extension fairly easily compared to when she started therapy when she couldn't get anywhere close to neutral hip extension. Internal and external rotation have also improved significantly. Her worst pain has improved from 10/10 at initial evaluation to 3/10 over the last week. Her L hip extension strength has improved however she continues to demonstrate deficits in L hip extension and abduction strength. Her FOTO score improved from 40 at initial evaluation to 60 today. She  continues to ambulate with left hip flexion posturing however it is notably improved from the initial evaluation. Continued with left hip flexor stretching as well as LE, back, and abdominal strengthening. Patient encouraged to continue HEP and follow-up as scheduled. She will benefit from PT services to address deficits in strength, range of motion, and mobility in order to return to full function at home.    Personal Factors and Comorbidities Age;Comorbidity 1;Time since onset of injury/illness/exacerbation    Comorbidities OA    Examination-Activity Limitations [place]    Examination-Participation Restrictions Community Activity;Occupation    Stability/Clinical Decision Making Stable/Uncomplicated    Rehab Potential Good    PT Frequency 2x / week    PT Duration 8 weeks    PT Treatment/Interventions ADLs/Self Care Home Management;Aquatic Therapy;Biofeedback;Canalith Repostioning;Iontophoresis 66m/ml Dexamethasone;Moist Heat;Electrical Stimulation;Cryotherapy;Traction;Ultrasound;Gait training;Stair training;Functional mobility training;Therapeutic activities;Therapeutic exercise;Neuromuscular re-education;Manual techniques;Passive range of motion;Dry needling;Vestibular;Spinal Manipulations;Joint Manipulations    PT Next Visit Plan Continue with manual techniques and stretching, progress HEP;    PT Home Exercise Plan Access Code: [email or web address] Prone positioning regularly throughout the day with gradually increasing frequency and duration    Consulted and Agree with Plan of Care Patient             Patient will benefit from skilled therapeutic intervention in order to improve the following deficits and impairments:  Pain, Decreased strength, Decreased range of motion, Impaired flexibility  Visit Diagnosis: Muscle weakness (generalized)  Pain in left hip     Problem List Patient Active Problem List   Diagnosis Date Noted   Primary osteoarthritis of left hip 08/13/2021   Muscular  deconditioning 08/13/2021   Spondylosis of lumbosacral region without myelopathy or radiculopathy 08/13/2021   Mixed hyperlipidemia 07/26/2021   Primary osteoarthritis involving multiple joints 06/29/2021   JPhillips GroutPT, DPT, GCS  Dennisha Mouser, PT 10/11/2021, 12:21 PM  [facility] [facility] [place] [address]Phone: [phone]  Fax:  [phone] Name:  JAKLYN ALEN MRN: [id number] Date of Birth: 1956/03/09

## 2021-10-16 ENCOUNTER — Ambulatory Visit: Payer: Medicare Other

## 2021-10-16 ENCOUNTER — Other Ambulatory Visit: Payer: Self-pay

## 2021-10-16 DIAGNOSIS — R2689 Other abnormalities of gait and mobility: Secondary | ICD-10-CM

## 2021-10-16 DIAGNOSIS — M6281 Muscle weakness (generalized): Secondary | ICD-10-CM | POA: Diagnosis not present

## 2021-10-16 DIAGNOSIS — M25552 Pain in left hip: Secondary | ICD-10-CM

## 2021-10-16 NOTE — Therapy (Signed)
[facility] [address] [place], [place], [address] Phone: [phone]   Fax:  [phone]  Physical Therapy Treatment   Patient Details  Name: Courtney Cervantes MRN: [id number] Date of Birth: [birth year] Referring Provider (PT): Dr. Zigmund Daniel   Encounter Date: 10/16/2021   PT End of Session - 10/16/21 0800     Visit Number 11    Number of Visits 17    Date for PT Re-Evaluation 10/23/21    Authorization Type Medicare    Authorization Time Period 08/28/21-10/23/21    PT Start Time 0757    PT Stop Time 0842    PT Time Calculation (min) 45 min    Activity Tolerance Patient tolerated treatment well    Behavior During Therapy [facility] for tasks assessed/performed             Past Medical History:  Diagnosis Date   Arthritis     Past Surgical History:  Procedure Laterality Date   APPENDECTOMY  1980   TUBAL LIGATION Bilateral [place]    There were no vitals filed for this visit.   Subjective Assessment - 10/16/21 0757     Subjective Pt reports that her L hip has been more sore lately. She is unsure why it has been bothering her more. She reports 3/10 L hip pain upon arrival. She had some GI upset on Friday but feels better now.  No specific questions currently.    Pertinent History Pt has a history of chronic low back pain and L hip soreness intermittently for years. When her L hip first started hurting it would occur at night and wake her up. At least 5 years ago the hip pain started to worsen however due to [place] she delayed seeking care. She did see her PCP in [place] who suggested a consult for a L hip replacement however pt would like to avoid surgery. She saw Dr. Zigmund Daniel who performed plain film radiographs and per medical record independent interpretation of lumbar x-ray films dated 07/02/2021 revealed overall maintained intervertebral space, anterior endplate osteophytes noted at L5, L4, L3  inferiorly, and L1, facet hypertrophy noted at the L4-5 articulation. Independent interpretation of left hip X-ray dated 07/02/2021 reveals moderate right and moderate to severe left hip osteoarthritis with subchondral sclerosis, deformation of the femoral head on the left, and acetabular osteophytes, no acute osseous process identified. Based on imaging and physical exam Dr. 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 She was prescribed duloxetine, diclofenac, and the muscle relaxers which have helped with her pain. Pt was referred to PT to focus on attaining maximal range of motion and improving strength. Pt reports that she heavily favors her RLE when standing. 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 She denies and LE weakness, buckling, or paresthesias.    Limitations Sitting;Standing    Diagnostic tests see history    Patient Stated Goals "I want to start walking and lose weight/get in shape" Decrease L hip pain               TREATMENT    Manual Therapy  NuStep L2-3 x 5 minutes for warm-up and L hip AAROM during interval history (3 minutes unbilled); Supine positioning with moist heat pack on left  hip in order to relax muscles and provide a gentle stretch; Hooklying L hip STM with Theraband roller and Hypervolt to decrease pain and improve tissue extensibility; Supine L hip anterior to posterior to anterior femur on pelvis mobilizations, grade II-III, 30s/bout x 3  bouts for pain modulation; Hooklying left hip FABER and FADIR stretches 2 x 60 seconds each; L hip belt assisted moblizations, inferior, grade II-III, 30s/bout x 3 bouts; L hip belt assisted moblizations, medial to lateral with hip at 45 degrees of flexion, grade II-III, 30s/bout x 3 bouts; L  hip belt assisted moblizations, inferior with L hip at 90 degrees of flexion, grade II-III, 30s/bout x 3 bouts; L hip belt assisted moblizations, inferior with hip in FABER position, grade II-III, 30s/bout x 3 bouts;   Pt educated throughout session about proper posture and technique with exercises. Improved exercise technique, movement at target joints, use of target muscles after min to mod verbal, visual, tactile cues.    Patient demonstrates excellent motivation during session today however she is frustrated by the worsening soreness over the last few days.  Session today focused mostly on light range of motion, stretching, and hip mobilization for pain modulation due to increased soreness.  Patient encouraged not to be frustrated by temporary increase in her soreness.  Continue with light stretching and range of motion activities at home until soreness resolves at which point we will resume more aggressive techniques for range of motion.  She continues to ambulate with left hip flexion posturing however overall her left hip range of motion is significantly improved from the initial evaluation.  Patient encouraged to follow-up as scheduled. She will benefit from PT services to address deficits in strength, range of motion, and mobility in order to return to full function at home.                        PT Short Term Goals - 10/11/21 0820       PT SHORT TERM GOAL #1   Title Pt will be independent with HEP in order to improve strength and decrease L hip pain in order to improve pain-free function at home, when painting, and with exercise    Time 4    Period Weeks    Status Partially Met    Target Date 09/25/21      PT SHORT TERM GOAL #2   Title Pt will decrease worst L hip pain as reported on NPRS by at least 3 points in order to demonstrate clinically significant reduction in hip pain.    Baseline 08/28/21: worst: 10/10; 10/11/21: 3/10;    Time 8    Period Weeks     Status Achieved    Target Date --               PT Long Term Goals - 10/11/21 0822       PT LONG TERM GOAL #1   Title Pt will increase FOTO to at least 58 in order to demonstrate significant improvement in function related to L hip pain    Baseline 08/28/21: 40; 10/11/21: 60    Time 8    Period Weeks    Status Achieved    Target Date --      PT LONG TERM GOAL #2   Title Pt will decrease worst pain as reported on NPRS to no greater than 4/10 in order to demonstrate clinically significant reduction in pain.    Baseline 08/28/21: worst: 10/10; 10/11/21;  3/10;    Time 8    Period Weeks    Status Achieved      PT LONG TERM GOAL #3   Title Pt will increase strength of  L hip abduction and extension to at least 4/5 in order to demonstrate improvement in strength and function.    Baseline 08/28/21: L hip abduction: 4-/5, extension: <3/5; 10/11/21: L hip abduction: 4-/5, extension: 3+/5;    Time 8    Period Weeks    Status Partially Met                   Plan - 10/16/21 0800     Clinical Impression Statement Patient demonstrates excellent motivation during session today however she is frustrated by the worsening soreness over the last few days.  Session today focused mostly on light range of motion, stretching, and hip mobilization for pain modulation due to increased soreness.  Patient encouraged not to be frustrated by temporary increase in her soreness.  Continue with light stretching and range of motion activities at home until soreness resolves at which point we will resume more aggressive techniques for range of motion.  She continues to ambulate with left hip flexion posturing however overall her left hip range of motion is significantly improved from the initial evaluation.  Patient encouraged to follow-up as scheduled. She will benefit from PT services to address deficits in strength, range of motion, and mobility in order to return to full function at home.    Personal  Factors and Comorbidities Age;Comorbidity 1;Time since onset of injury/illness/exacerbation    Comorbidities OA    Examination-Activity Limitations [place]    Examination-Participation Restrictions Community Activity;Occupation    Stability/Clinical Decision Making Stable/Uncomplicated    Rehab Potential Good    PT Frequency 2x / week    PT Duration 8 weeks    PT Treatment/Interventions ADLs/Self Care Home Management;Aquatic Therapy;Biofeedback;Canalith Repostioning;Iontophoresis 66m/ml Dexamethasone;Moist Heat;Electrical Stimulation;Cryotherapy;Traction;Ultrasound;Gait training;Stair training;Functional mobility training;Therapeutic activities;Therapeutic exercise;Neuromuscular re-education;Manual techniques;Passive range of motion;Dry needling;Vestibular;Spinal Manipulations;Joint Manipulations    PT Next Visit Plan Continue with manual techniques and stretching, progress HEP;    PT Home Exercise Plan Access Code: [email or web address] Prone positioning regularly throughout the day with gradually increasing frequency and duration    Consulted and Agree with Plan of Care Patient             Patient will benefit from skilled therapeutic intervention in order to improve the following deficits and impairments:  Pain, Decreased strength, Decreased range of motion, Impaired flexibility  Visit Diagnosis: Pain in left hip  Other abnormalities of gait and mobility     Problem List Patient Active Problem List   Diagnosis Date Noted   Primary osteoarthritis of left hip 08/13/2021   Muscular deconditioning 08/13/2021   Spondylosis of lumbosacral region without myelopathy or radiculopathy 08/13/2021   Mixed hyperlipidemia 07/26/2021   Primary osteoarthritis involving multiple joints 06/29/2021   JPhillips GroutPT, DPT, GCS  Orin Eberwein, PT 10/16/2021, 11:36 AM  [place] [facility] [place] [address]Phone: [phone]  Fax:   [phone] Name: Courtney MONDAMRN: [id number] of Birth: 105/30/1957

## 2021-10-18 ENCOUNTER — Ambulatory Visit: Payer: Medicare Other

## 2021-10-18 ENCOUNTER — Other Ambulatory Visit: Payer: Self-pay

## 2021-10-18 DIAGNOSIS — M6281 Muscle weakness (generalized): Secondary | ICD-10-CM | POA: Diagnosis not present

## 2021-10-18 DIAGNOSIS — M25552 Pain in left hip: Secondary | ICD-10-CM

## 2021-10-18 DIAGNOSIS — R2689 Other abnormalities of gait and mobility: Secondary | ICD-10-CM

## 2021-10-18 NOTE — Therapy (Signed)
[place] [facility] [address] [place], [place], [address] Phone: [phone]   Fax:  [phone]  Physical Therapy Treatment   Patient Details  Name: Courtney Cervantes MRN: [id number] Date of Birth: November 24, 1956 Referring Provider (PT): Dr. Zigmund Daniel   Encounter Date: 10/18/2021   PT End of Session - 10/18/21 1131     Visit Number 12    Number of Visits 17    Date for PT Re-Evaluation 10/23/21    Authorization Type Medicare    Authorization Time Period 08/28/21-10/23/21    PT Start Time 0802    PT Stop Time 0845    PT Time Calculation (min) 43 min    Activity Tolerance Patient tolerated treatment well    Behavior During Therapy [place] for tasks assessed/performed             Past Medical History:  Diagnosis Date   Arthritis     Past Surgical History:  Procedure Laterality Date   [place] Bilateral [place]    There were no vitals filed for this visit.   Subjective Assessment - 10/18/21 1130     Subjective Pt reports that her L hip has been better over the last couple days. Denies any resting pain upon arrival. No excessive pain/soreness with stretching. No specific questions currently.    Pertinent History Pt has a history of chronic low back pain and L hip soreness intermittently for years. When her L hip first started hurting it would occur at night and wake her up. At least 5 years ago the hip pain started to worsen however due to [place] she delayed seeking care. She did see her PCP in [place] who suggested a consult for a L hip replacement however pt would like to avoid surgery. She saw Dr. Zigmund Daniel who performed plain film radiographs and per medical record independent interpretation of lumbar x-ray films dated 07/02/2021 revealed overall maintained intervertebral space, anterior endplate osteophytes noted at L5, L4, L3 inferiorly, and L1, facet hypertrophy noted at the  L4-5 articulation. Independent interpretation of left hip X-ray dated 07/02/2021 reveals moderate right and moderate to severe left hip osteoarthritis with subchondral sclerosis, deformation of the femoral head on the left, and acetabular osteophytes, no acute osseous process identified. Based on imaging and physical exam Dr. 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 She was prescribed duloxetine, diclofenac, and the muscle relaxers which have helped with her pain. Pt was referred to PT to focus on attaining maximal range of motion and improving strength. Pt reports that she heavily favors her RLE when standing. 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 She denies and LE weakness, buckling, or paresthesias.    Limitations Sitting;Standing    Diagnostic tests see history    Patient Stated Goals "I want to start walking and lose weight/get in shape" Decrease L hip pain    Currently in Pain? No/denies               TREATMENT    Manual Therapy  NuStep L2-3 x 5 minutes for warm-up and L hip AAROM during interval history (3 minutes unbilled); Prone positioning with moist heat pack under left hip in order to improve extension range of motion,  eventually removed heat pack to increase L hip extension and finally utilized 3" 1/2 foam roll under distal thigh;  Prone left hip internal and external rotation as well as quad stretching 2 x 60 seconds each; Prone posterior to anterior femur on pelvis mobilizations, grade II-III, 30s/bout x 3  bouts; Prone L2-L5 grade III mobilizations, 20s/bout x 1 bout/level; Supine L hip flexor stretch off edge of table with cues to prevent lumbar extension and manual pelvis block provided by therapist x 30 seconds; Hooklying left hip FABER  and FADIR stretches 2 x 60 seconds each; Supine L hip manual long axis distraction 15s x 3;   Pt educated throughout session about proper posture and technique with exercises. Improved exercise technique, movement at target joints, use of target muscles after min to mod verbal, visual, tactile cues.    Patient demonstrates excellent motivation during session today.  She has significant less soreness in her left hip today.  Continued with light range of motion, stretching, and hip mobilization for hip mobility.  She demonstrates significant improvement in her left hip external rotation as well as extension throughout session.  Her upright posture is much better at the end of the session.  Patient encouraged to follow-up as scheduled. She will benefit from PT services to address deficits in strength, range of motion, and mobility in order to return to full function at home.                        PT Short Term Goals - 10/11/21 0820       PT SHORT TERM GOAL #1   Title Pt will be independent with HEP in order to improve strength and decrease L hip pain in order to improve pain-free function at home, when painting, and with exercise    Time 4    Period Weeks    Status Partially Met    Target Date 09/25/21      PT SHORT TERM GOAL #2   Title Pt will decrease worst L hip pain as reported on NPRS by at least 3 points in order to demonstrate clinically significant reduction in hip pain.    Baseline 08/28/21: worst: 10/10; 10/11/21: 3/10;    Time 8    Period Weeks    Status Achieved    Target Date --               PT Long Term Goals - 10/11/21 0822       PT LONG TERM GOAL #1   Title Pt will increase FOTO to at least 58 in order to demonstrate significant improvement in function related to L hip pain    Baseline 08/28/21: 40; 10/11/21: 60    Time 8    Period Weeks    Status Achieved    Target Date --      PT LONG TERM GOAL #2   Title Pt will decrease worst pain as  reported on NPRS to no greater than 4/10 in order to demonstrate clinically significant reduction in pain.    Baseline 08/28/21: worst: 10/10; 10/11/21; 3/10;    Time 8    Period Weeks    Status Achieved      PT LONG TERM GOAL #3   Title Pt will increase strength of  L hip abduction and extension to at least 4/5 in order to demonstrate improvement in strength and function.    Baseline 08/28/21: L hip abduction: 4-/5, extension: <3/5; 10/11/21: L hip abduction:  4-/5, extension: 3+/5;    Time 8    Period Weeks    Status Partially Met                   Plan - 10/18/21 1133     Clinical Impression Statement Patient demonstrates excellent motivation during session today.  She has significant less soreness in her left hip today.  Continued with light range of motion, stretching, and hip mobilization for hip mobility.  She demonstrates significant improvement in her left hip external rotation as well as extension throughout session.  Her upright posture is much better at the end of the session.  Patient encouraged to follow-up as scheduled. She will benefit from PT services to address deficits in strength, range of motion, and mobility in order to return to full function at home.    Personal Factors and Comorbidities Age;Comorbidity 1;Time since onset of injury/illness/exacerbation    Comorbidities OA    Examination-Activity Limitations [place]    Examination-Participation Restrictions Community Activity;Occupation    Stability/Clinical Decision Making Stable/Uncomplicated    Rehab Potential Good    PT Frequency 2x / week    PT Duration 8 weeks    PT Treatment/Interventions ADLs/Self Care Home Management;Aquatic Therapy;Biofeedback;Canalith Repostioning;Iontophoresis 23m/ml Dexamethasone;Moist Heat;Electrical Stimulation;Cryotherapy;Traction;Ultrasound;Gait training;Stair training;Functional mobility training;Therapeutic activities;Therapeutic exercise;Neuromuscular re-education;Manual  techniques;Passive range of motion;Dry needling;Vestibular;Spinal Manipulations;Joint Manipulations    PT Next Visit Plan Continue with manual techniques and stretching, progress HEP;    PT Home Exercise Plan Access Code: [email or web address] Prone positioning regularly throughout the day with gradually increasing frequency and duration    Consulted and Agree with Plan of Care Patient             Patient will benefit from skilled therapeutic intervention in order to improve the following deficits and impairments:  Pain, Decreased strength, Decreased range of motion, Impaired flexibility  Visit Diagnosis: Pain in left hip  Muscle weakness (generalized)  Other abnormalities of gait and mobility     Problem List Patient Active Problem List   Diagnosis Date Noted   Primary osteoarthritis of left hip 08/13/2021   Muscular deconditioning 08/13/2021   Spondylosis of lumbosacral region without myelopathy or radiculopathy 08/13/2021   Mixed hyperlipidemia 07/26/2021   Primary osteoarthritis involving multiple joints 06/29/2021   JPhillips GroutPT, DPT, GCS  Tana Trefry, PT 10/18/2021, 1:27 PM  [place] [facility] [place] [address]Phone: [phone]  Fax:  [phone] Name: Courtney CAYABYABMRN: [id number] of Birth: 111-Aug-1957

## 2021-10-22 ENCOUNTER — Other Ambulatory Visit: Payer: Self-pay

## 2021-10-22 ENCOUNTER — Encounter: Payer: Self-pay | Admitting: Family Medicine

## 2021-10-22 ENCOUNTER — Inpatient Hospital Stay (INDEPENDENT_AMBULATORY_CARE_PROVIDER_SITE_OTHER): Payer: Medicare Other | Admitting: Radiology

## 2021-10-22 ENCOUNTER — Ambulatory Visit (INDEPENDENT_AMBULATORY_CARE_PROVIDER_SITE_OTHER): Payer: Medicare Other | Admitting: Family Medicine

## 2021-10-22 VITALS — BP 118/82 | HR 88 | Ht 69.0 in | Wt 212.0 lb

## 2021-10-22 DIAGNOSIS — R29898 Other symptoms and signs involving the musculoskeletal system: Secondary | ICD-10-CM | POA: Diagnosis not present

## 2021-10-22 DIAGNOSIS — M1612 Unilateral primary osteoarthritis, left hip: Secondary | ICD-10-CM | POA: Diagnosis not present

## 2021-10-22 DIAGNOSIS — M47817 Spondylosis without myelopathy or radiculopathy, lumbosacral region: Secondary | ICD-10-CM

## 2021-10-22 MED ORDER — TRIAMCINOLONE ACETONIDE 40 MG/ML IJ SUSP
40.0000 mg | Freq: Once | INTRAMUSCULAR | Status: AC
  Administered 2021-10-22: 40 mg

## 2021-10-22 NOTE — Patient Instructions (Addendum)
You have just been given a cortisone injection to reduce pain and inflammation. After the injection you may notice immediate relief of pain as a result of the Lidocaine. It is important to rest the area of the injection for 24 to 48 hours after the injection. There is a possibility of some temporary increased discomfort and swelling for up to 72 hours until the cortisone begins to work. If you do have pain, simply rest the joint and use ice. If you can tolerate over the counter medications, you can try Tylenol, Aleve, or Advil for added relief per package instructions. - Transition to as-needed dosing diclofenac - Continue Cymbalta (duloxetine) - Stop cyclobenzaprine - Return in 6 weeks

## 2021-10-22 NOTE — Assessment & Plan Note (Signed)
Patient is demonstrated interval improvement in her left hip symptomatology, has been compliant with scheduled duloxetine and diclofenac, has not required cyclobenzaprine, has been attending formal physical therapy.  She reports overall improvement of this chronic issue though does have episodic "flareups "involving significant stiffness localized about the left hip.  Physical examination shows improved though limited left hip internal rotation with positive FADIR, negative straight leg raise.  Given her improvement, I have reviewed additional strategies and she has elected to proceed with ultrasound-guided left hip joint injection, post care reviewed.  Medication management discussed and she will discontinue cyclobenzaprine, transition to as needed diclofenac, and continue scheduled duloxetine.  She is to continue with formal physical therapy and home exercises, plan to maintain follow-up in 6 weeks for reevaluation.  If suboptimal response despite adherence to the above, can discuss PRP, advanced imaging, surgical options, further focus on lumbar spine.

## 2021-10-22 NOTE — Progress Notes (Signed)
     Primary Care / Sports Medicine Office Visit  Patient Information:  Patient ID: Courtney Cervantes, female DOB: [birth year] Age: 65 y.o. MRN: [id number]   Courtney Cervantes is a pleasant 65 y.o. female presenting with the following:  Chief Complaint  Patient presents with   Primary osteoarthritis of left hip   Muscular deconditioning   Spondylosis of lumbosacral region without myelopathy     Patient Active Problem List   Diagnosis Date Noted   Primary osteoarthritis of left hip 08/13/2021   Muscular deconditioning 08/13/2021   Spondylosis of lumbosacral region without myelopathy or radiculopathy 08/13/2021   Mixed hyperlipidemia 07/26/2021   Primary osteoarthritis involving multiple joints 06/29/2021    Vitals:   10/22/21 0841  BP: 118/82  Pulse: 88  SpO2: 96%   Vitals:   10/22/21 0841  Weight: 212 lb (96.2 kg)  Height: 5\' 9"  (1.753 m)   Body mass index is 31.31 kg/m.  No results found.   Independent interpretation of notes and tests performed by another provider:   None  Procedures performed:   Procedure:  Injection of left hip joint under ultrasound guidance. Ultrasound guidance utilized for in plane approach to femoral acetabular junction on the left, noted cortical irregularity consistent with her osteoarthritis, vascular structures avoided Samsung HS60 device utilized with permanent recording / reporting. Consent obtained and verified. Skin prepped in a sterile fashion. Ethyl chloride spray for topical local analgesia.  Completed without difficulty and tolerated well. Medication: triamcinolone acetonide 40 mg/mL suspension for injection 1 mL total and 2 mL lidocaine 1% without epinephrine utilized for needle placement anesthetic Advised to contact for fevers/chills, erythema, induration, drainage, or persistent bleeding.   Pertinent History, Exam, Impression, and Recommendations:   Primary osteoarthritis of left hip Patient is demonstrated  interval improvement in her left hip symptomatology, has been compliant with scheduled duloxetine and diclofenac, has not required cyclobenzaprine, has been attending formal physical therapy.  She reports overall improvement of this chronic issue though does have episodic "flareups "involving significant stiffness localized about the left hip.  Physical examination shows improved though limited left hip internal rotation with positive FADIR, negative straight leg raise.  Given her improvement, I have reviewed additional strategies and she has elected to proceed with ultrasound-guided left hip joint injection, post care reviewed.  Medication management discussed and she will discontinue cyclobenzaprine, transition to as needed diclofenac, and continue scheduled duloxetine.  She is to continue with formal physical therapy and home exercises, plan to maintain follow-up in 6 weeks for reevaluation.  If suboptimal response despite adherence to the above, can discuss PRP, advanced imaging, surgical options, further focus on lumbar spine.  Spondylosis of lumbosacral region without myelopathy or radiculopathy Chronic condition that has stabilized, this is in the setting of severe left hip osteoarthritis. See additional assessment(s) for plan details.  Muscular deconditioning Chronic condition that is stable. See additional assessment(s) for plan details.   Orders & Medications Meds ordered this encounter  Medications   triamcinolone acetonide (KENALOG-40) injection 40 mg   Orders Placed This Encounter  Procedures   LIMITED JOINT SPACE STRUCTURES LOW LEFT     Return in about 6 weeks (around 12/03/2021).     01/31/2022, MD   Primary Care Sports Medicine [facility]

## 2021-10-22 NOTE — Assessment & Plan Note (Signed)
Chronic condition that has stabilized, this is in the setting of severe left hip osteoarthritis. See additional assessment(s) for plan details.

## 2021-10-22 NOTE — Assessment & Plan Note (Signed)
Chronic condition that is stable. See additional assessment(s) for plan details. °

## 2021-10-23 ENCOUNTER — Ambulatory Visit: Payer: Medicare Other

## 2021-10-23 DIAGNOSIS — M25552 Pain in left hip: Secondary | ICD-10-CM

## 2021-10-23 DIAGNOSIS — M6281 Muscle weakness (generalized): Secondary | ICD-10-CM | POA: Diagnosis not present

## 2021-10-23 NOTE — Therapy (Signed)
[place] [facility] [address] [place], [place], [address] Phone: [phone]   Fax:  [phone]  Physical Therapy Treatment   Patient Details  Name: Courtney Cervantes MRN: [id number] Date of Birth: 1956/11/30 Referring Provider (PT): Dr. Zigmund Daniel   Encounter Date: 10/23/2021   PT End of Session - 10/23/21 0911     Visit Number 13    Number of Visits 17    Date for PT Re-Evaluation 10/23/21    Authorization Type Medicare    Authorization Time Period 08/28/21-10/23/21    PT Start Time 0903    PT Stop Time 0930    PT Time Calculation (min) 27 min    Activity Tolerance Patient tolerated treatment well    Behavior During Therapy [facility] for tasks assessed/performed             Past Medical History:  Diagnosis Date   Arthritis     Past Surgical History:  Procedure Laterality Date   [place] Bilateral [place]    There were no vitals filed for this visit.   Subjective Assessment - 10/23/21 0906     Subjective Pt reports that her L hip is a little sore today because she got an injection yesterday at her appointment with Dr. Zigmund Daniel. She rates the discomfort as 2/10. She reports that she feels like the injection has helped with the "raw" feeling in the hip. No specific questions currently.    Pertinent History Pt has a history of chronic low back pain and L hip soreness intermittently for years. When her L hip first started hurting it would occur at night and wake her up. At least 5 years ago the hip pain started to worsen however due to [place] she delayed seeking care. She did see her PCP in [place] who suggested a consult for a L hip replacement however pt would like to avoid surgery. She saw Dr. Zigmund Daniel who performed plain film radiographs and per medical record independent interpretation of lumbar x-ray films dated 07/02/2021 revealed overall maintained intervertebral space,  anterior endplate osteophytes noted at L5, L4, L3 inferiorly, and L1, facet hypertrophy noted at the L4-5 articulation. Independent interpretation of left hip X-ray dated 07/02/2021 reveals moderate right and moderate to severe left hip osteoarthritis with subchondral sclerosis, deformation of the femoral head on the left, and acetabular osteophytes, no acute osseous process identified. Based on imaging and physical exam Dr. 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 She was prescribed duloxetine, diclofenac, and the muscle relaxers which have helped with her pain. Pt was referred to PT to focus on attaining maximal range of motion and improving strength. Pt reports that she heavily favors her RLE when standing. 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 She denies and LE weakness, buckling, or paresthesias.    Limitations Sitting;Standing    Diagnostic tests see history    Patient Stated Goals "I want to start walking and lose weight/get in shape" Decrease L hip pain               TREATMENT    Manual Therapy  NuStep L2-3 x 4 minutes for warm-up and L hip AAROM during interval history; Prone positioning with moist heat pack  under left hip in order to improve extension range of motion, eventually removed heat pack to increase L hip extension and finally utilized 3" 1/2 foam roll under distal thigh;  Prone left hip internal and external rotation as well as quad stretching 2 x 60 seconds each; Prone posterior to anterior femur on pelvis mobilizations, grade II-III, 30s/bout x 3  bouts; Prone L3-L5 grade III mobilizations, 20s/bout x 1 bout/level;   Pt educated throughout session about proper posture and technique with exercises. Improved exercise technique, movement  at target joints, use of target muscles after min to mod verbal, visual, tactile cues.    Patient demonstrates excellent motivation during session today.  She is reporting improvement in her hip pain since her injection yesterday. She arrived late for her appointment and given injection yesterday worked on light range of motion/stretching/joint mobilizations during session today. Will update goals/outcome measures/recertification at next visit.  Patient encouraged to follow-up as scheduled. She will benefit from PT services to address deficits in strength, range of motion, and mobility in order to return to full function at home.                        PT Short Term Goals - 10/11/21 0820       PT SHORT TERM GOAL #1   Title Pt will be independent with HEP in order to improve strength and decrease L hip pain in order to improve pain-free function at home, when painting, and with exercise    Time 4    Period Weeks    Status Partially Met    Target Date 09/25/21      PT SHORT TERM GOAL #2   Title Pt will decrease worst L hip pain as reported on NPRS by at least 3 points in order to demonstrate clinically significant reduction in hip pain.    Baseline 08/28/21: worst: 10/10; 10/11/21: 3/10;    Time 8    Period Weeks    Status Achieved    Target Date --               PT Long Term Goals - 10/11/21 0822       PT LONG TERM GOAL #1   Title Pt will increase FOTO to at least 58 in order to demonstrate significant improvement in function related to L hip pain    Baseline 08/28/21: 40; 10/11/21: 60    Time 8    Period Weeks    Status Achieved    Target Date --      PT LONG TERM GOAL #2   Title Pt will decrease worst pain as reported on NPRS to no greater than 4/10 in order to demonstrate clinically significant reduction in pain.    Baseline 08/28/21: worst: 10/10; 10/11/21; 3/10;    Time 8    Period Weeks    Status Achieved      PT LONG TERM GOAL #3   Title Pt will  increase strength of  L hip abduction and extension to at least 4/5 in order to demonstrate improvement in strength and function.    Baseline 08/28/21: L hip abduction: 4-/5, extension: <3/5; 10/11/21: L hip abduction: 4-/5, extension: 3+/5;    Time 8    Period Weeks    Status Partially Met                   Plan - 10/23/21 0947     Clinical Impression Statement Patient demonstrates excellent  motivation during session today.  She is reporting improvement in her hip pain since her injection yesterday. She arrived late for her appointment and given injection yesterday worked on light range of motion/stretching/joint mobilizations during session today. Will update goals/outcome measures/recertification at next visit.  Patient encouraged to follow-up as scheduled. She will benefit from PT services to address deficits in strength, range of motion, and mobility in order to return to full function at home.    Personal Factors and Comorbidities Age;Comorbidity 1;Time since onset of injury/illness/exacerbation    Comorbidities OA    Examination-Activity Limitations [place]    Examination-Participation Restrictions Community Activity;Occupation    Stability/Clinical Decision Making Stable/Uncomplicated    Rehab Potential Good    PT Frequency 2x / week    PT Duration 8 weeks    PT Treatment/Interventions ADLs/Self Care Home Management;Aquatic Therapy;Biofeedback;Canalith Repostioning;Iontophoresis 78m/ml Dexamethasone;Moist Heat;Electrical Stimulation;Cryotherapy;Traction;Ultrasound;Gait training;Stair training;Functional mobility training;Therapeutic activities;Therapeutic exercise;Neuromuscular re-education;Manual techniques;Passive range of motion;Dry needling;Vestibular;Spinal Manipulations;Joint Manipulations    PT Next Visit Plan Update outcome measures/goals/recertification. Continue with manual techniques and stretching, progress HEP;    PT Home Exercise Plan Access Code: [email or web address] Prone  positioning regularly throughout the day with gradually increasing frequency and duration    Consulted and Agree with Plan of Care Patient              Patient will benefit from skilled therapeutic intervention in order to improve the following deficits and impairments:  Pain, Decreased strength, Decreased range of motion, Impaired flexibility  Visit Diagnosis: Pain in left hip  Muscle weakness (generalized)     Problem List Patient Active Problem List   Diagnosis Date Noted   Primary osteoarthritis of left hip 08/13/2021   Muscular deconditioning 08/13/2021   Spondylosis of lumbosacral region without myelopathy or radiculopathy 08/13/2021   Mixed hyperlipidemia 07/26/2021   Primary osteoarthritis involving multiple joints 06/29/2021   JPhillips GroutPT, DPT, GCS  Jackee Glasner, PT 10/23/2021, 9:49 AM  [place] [facility] [place] [address]Phone: [phone]  Fax:  [phone] Name: Courtney SPEAKERMRN: [id number] of Birth: 1March 26, 1957

## 2021-10-30 ENCOUNTER — Ambulatory Visit: Payer: Medicare Other

## 2021-10-30 ENCOUNTER — Other Ambulatory Visit: Payer: Self-pay

## 2021-10-30 VITALS — BP 149/89 | HR 90

## 2021-10-30 DIAGNOSIS — R2689 Other abnormalities of gait and mobility: Secondary | ICD-10-CM

## 2021-10-30 DIAGNOSIS — M6281 Muscle weakness (generalized): Secondary | ICD-10-CM | POA: Diagnosis not present

## 2021-10-30 DIAGNOSIS — M25552 Pain in left hip: Secondary | ICD-10-CM

## 2021-10-30 NOTE — Therapy (Signed)
[facility] [address] [place], [place], [address] Phone: [phone]   Fax:  [phone]  Physical Therapy Treatment   Patient Details  Name: Courtney Cervantes MRN: [id number] Date of Birth: Jul 30, 1956 Referring Provider (PT): Dr. Zigmund Daniel   Encounter Date: 10/30/2021   PT End of Session - 10/30/21 1117     Visit Number 14    Number of Visits 33    Date for PT Re-Evaluation 12/25/21    Authorization Type Medicare    Authorization Time Period --    PT Start Time 1100    PT Stop Time 1145    PT Time Calculation (min) 45 min    Activity Tolerance Patient tolerated treatment well    Behavior During Therapy [facility] for tasks assessed/performed             Past Medical History:  Diagnosis Date   Arthritis     Past Surgical History:  Procedure Laterality Date   APPENDECTOMY  1980   TUBAL LIGATION Bilateral 1994   UTERINE FIBROID SURGERY  1980    Vitals:   10/30/21 1109  BP: (!) 149/89  Pulse: 90  SpO2: 99%     Subjective Assessment - 10/30/21 1112     Subjective Pt reports that she is doing well today. She denies any hip pain upon arrival. She states that the day after her last therapy session she had 3 days of significant fatigue as well as intermittent chills/hot flashes. She denies any fevers. No swelling, redness, or pain to her L hip. Denies any signs of infection. She has left a message with Dr. Zigmund Daniel regarding these symptoms but has not heard back from their office.    Pertinent History Pt has a history of chronic low back pain and L hip soreness intermittently for years. When her L hip first started hurting it would occur at night and wake her up. At least 5 years ago the hip pain started to worsen however due to [place] she delayed seeking care. She did see her PCP in [place] who suggested a consult for a L hip replacement however pt would like to avoid surgery. She saw Dr. Zigmund Daniel who performed plain film  radiographs and per medical record independent interpretation of lumbar x-ray films dated 07/02/2021 revealed overall maintained intervertebral space, anterior endplate osteophytes noted at L5, L4, L3 inferiorly, and L1, facet hypertrophy noted at the L4-5 articulation. Independent interpretation of left hip X-ray dated 07/02/2021 reveals moderate right and moderate to severe left hip osteoarthritis with subchondral sclerosis, deformation of the femoral head on the left, and acetabular osteophytes, no acute osseous process identified. Based on imaging and physical exam Dr. 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 She was prescribed duloxetine, diclofenac, and the muscle relaxers which have helped with her pain. Pt was referred to PT to focus on attaining maximal range of motion and improving strength. Pt reports that she heavily favors her RLE when standing. 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 She denies and LE weakness, buckling, or paresthesias.    Limitations Sitting;Standing    Diagnostic tests see history    Patient Stated Goals "I want to start walking and lose weight/get in shape" Decrease L hip pain    Currently in Pain? No/denies  TREATMENT   Manual Therapy  NuStep L2-3 x 5 minutes for warm-up and L hip AAROM during interval history; Prone L1-L5 grade III mobilizations, 20s/bout x 1 bout/level; Prone left hip internal and external rotation as well as quad stretching 2 x 60 seconds each; Prone posterior to anterior femur on pelvis mobilizations, grade II-III, 30s/bout x 3  bouts, repeated an additional 3 bouts with 1/2 foam roll under distal thigh to improve hip extension; Supine L hip FABER stretch 2 x 60s; Supine L hip flexor  stretch off edge of table 2 x 60s; Supine L hip belt assisted moblizations, inferior, grade II-III, 30s/bout x 3 bouts; Supine L hip belt assisted moblizations, medial to lateral with hip at 45 degrees of flexion, grade II-III, 30s/bout x 3 bouts; Supine L hip belt assisted moblizations, inferior with L hip at 90 degrees of flexion, grade II-III, 30s/bout x 3 bouts; Supine L hip belt assisted moblizations, inferior with hip in FABER position, grade II-III, 30s/bout x 3 bouts;   Pt educated throughout session about proper posture and technique with exercises. Improved exercise technique, movement at target joints, use of target muscles after min to mod verbal, visual, tactile cues.    Patient demonstrates excellent motivation during session today.  Updated outcome measures and goals with patient during 10/11/21 visit so did not update again today. She demonstrates significant improvement in L hip range of motion especially extension. She is now able to reach neutral L hip extension fairly easily compared to when she started therapy when she couldn't get anywhere close to neutral hip extension. Internal and external rotation have also improved significantly. Her worst pain has improved from 10/10 at initial evaluation to 3/10 and has improved even more since her steroid injection. Her L hip extension strength has improved however she continues to demonstrate deficits in L hip extension and abduction strength. Her FOTO score improved from 40 at initial evaluation to 60 on 10/11/21. She continues to ambulate with left hip flexion posturing however it is notably improved from the initial evaluation. Continued with left hip flexor stretching and manual techniques during session today. No warmth, tenderness, bruising, redness, or pain to L hip. Vitals are WNL. Pt encouraged to follow-up with Dr. Zigmund Daniel regarding her symptoms since the steroid injection but reassurance provided. Patient encouraged to continue  HEP and follow-up as scheduled. She will benefit from PT services to address deficits in strength, range of motion, and mobility in order to return to full function at home.                         PT Short Term Goals - 10/30/21 1351       PT SHORT TERM GOAL #1   Title Pt will be independent with HEP in order to improve strength and decrease L hip pain in order to improve pain-free function at home, when painting, and with exercise    Time 4    Period Weeks    Status Partially Met    Target Date 11/27/21      PT SHORT TERM GOAL #2   Title Pt will decrease worst L hip pain as reported on NPRS by at least 3 points in order to demonstrate clinically significant reduction in hip pain.    Baseline 08/28/21: worst: 10/10; 10/11/21: 3/10;    Time 8    Period Weeks    Status Achieved  PT Long Term Goals - 10/30/21 1351       PT LONG TERM GOAL #1   Title Pt will increase FOTO to at least 58 in order to demonstrate significant improvement in function related to L hip pain    Baseline 08/28/21: 40; 10/11/21: 60    Time 8    Period Weeks    Status Achieved      PT LONG TERM GOAL #2   Title Pt will decrease worst pain as reported on NPRS to no greater than 4/10 in order to demonstrate clinically significant reduction in pain.    Baseline 08/28/21: worst: 10/10; 10/11/21; 3/10;    Time 8    Period Weeks    Status Achieved      PT LONG TERM GOAL #3   Title Pt will increase strength of  L hip abduction and extension to at least 4/5 in order to demonstrate improvement in strength and function.    Baseline 08/28/21: L hip abduction: 4-/5, extension: <3/5; 10/11/21: L hip abduction: 4-/5, extension: 3+/5;    Time 8    Period Weeks    Status Partially Met                   Plan - 10/30/21 1118     Clinical Impression Statement Patient demonstrates excellent motivation during session today.  Updated outcome measures and goals with patient during  10/11/21 visit so did not update again today. She demonstrates significant improvement in L hip range of motion especially extension. She is now able to reach neutral L hip extension fairly easily compared to when she started therapy when she couldn't get anywhere close to neutral hip extension. Internal and external rotation have also improved significantly. Her worst pain has improved from 10/10 at initial evaluation to 3/10 and has improved even more since her steroid injection. Her L hip extension strength has improved however she continues to demonstrate deficits in L hip extension and abduction strength. Her FOTO score improved from 40 at initial evaluation to 60 on 10/11/21. She continues to ambulate with left hip flexion posturing however it is notably improved from the initial evaluation. Continued with left hip flexor stretching and manual techniques during session today. No warmth, tenderness, bruising, redness, or pain to L hip. Vitals are WNL. Pt encouraged to follow-up with Dr. Zigmund Daniel regarding her symptoms since the steroid injection but reassurance provided. Patient encouraged to continue HEP and follow-up as scheduled. She will benefit from PT services to address deficits in strength, range of motion, and mobility in order to return to full function at home.    Personal Factors and Comorbidities Age;Comorbidity 1;Time since onset of injury/illness/exacerbation    Comorbidities OA    Examination-Activity Limitations [place]    Examination-Participation Restrictions Community Activity;Occupation    Stability/Clinical Decision Making Stable/Uncomplicated    Rehab Potential Good    PT Frequency 2x / week    PT Duration 8 weeks    PT Treatment/Interventions ADLs/Self Care Home Management;Aquatic Therapy;Biofeedback;Canalith Repostioning;Iontophoresis 55m/ml Dexamethasone;Moist Heat;Electrical Stimulation;Cryotherapy;Traction;Ultrasound;Gait training;Stair training;Functional mobility  training;Therapeutic activities;Therapeutic exercise;Neuromuscular re-education;Manual techniques;Passive range of motion;Dry needling;Vestibular;Spinal Manipulations;Joint Manipulations    PT Next Visit Plan Continue with manual techniques and stretching, progress HEP;    PT Home Exercise Plan Access Code: [email or web address] Prone positioning regularly throughout the day with gradually increasing frequency and duration    Consulted and Agree with Plan of Care Patient              Patient will benefit from  skilled therapeutic intervention in order to improve the following deficits and impairments:  Pain, Decreased strength, Decreased range of motion, Impaired flexibility  Visit Diagnosis: Pain in left hip  Muscle weakness (generalized)  Other abnormalities of gait and mobility     Problem List Patient Active Problem List   Diagnosis Date Noted   Primary osteoarthritis of left hip 08/13/2021   Muscular deconditioning 08/13/2021   Spondylosis of lumbosacral region without myelopathy or radiculopathy 08/13/2021   Mixed hyperlipidemia 07/26/2021   Primary osteoarthritis involving multiple joints 06/29/2021   Phillips Grout PT, DPT, GCS  Gerlean Cid, PT 10/30/2021, 1:59 PM  [facility] [address] [place], [place], [address] Phone: [phone]   Fax:  [phone]  Name: Courtney Cervantes MRN: [id number] Date of Birth: 11-18-1956

## 2021-11-01 ENCOUNTER — Other Ambulatory Visit: Payer: Self-pay

## 2021-11-01 ENCOUNTER — Ambulatory Visit: Payer: Medicare Other | Attending: Family Medicine

## 2021-11-01 DIAGNOSIS — M6281 Muscle weakness (generalized): Secondary | ICD-10-CM | POA: Diagnosis present

## 2021-11-01 DIAGNOSIS — R2689 Other abnormalities of gait and mobility: Secondary | ICD-10-CM | POA: Insufficient documentation

## 2021-11-01 DIAGNOSIS — M25552 Pain in left hip: Secondary | ICD-10-CM | POA: Insufficient documentation

## 2021-11-01 NOTE — Therapy (Signed)
[facility] [address] [place], [place], [address] Phone: [phone]   Fax:  [phone]  Physical Therapy Treatment   Patient Details  Name: Courtney Cervantes MRN: [id number] Date of Birth: November 11, 1956 Referring Provider (PT): Dr. Zigmund Daniel   Encounter Date: 11/01/2021   PT End of Session - 11/01/21 1113     Visit Number 15    Number of Visits 33    Date for PT Re-Evaluation 12/25/21    Authorization Type Medicare    PT Start Time 1100    PT Stop Time 1145    PT Time Calculation (min) 45 min    Activity Tolerance Patient tolerated treatment well    Behavior During Therapy [facility] for tasks assessed/performed             Past Medical History:  Diagnosis Date   Arthritis     Past Surgical History:  Procedure Laterality Date   [place] Bilateral [place]    There were no vitals filed for this visit.    Subjective Assessment - 11/01/21 1204     Subjective Pt reports that she is doing well today. She denies any hip pain upon arrival. Her chills have resolved and she feels like she has returned back to normal. Pt would like to know if she can decrease the frequency of her therapy to once per week.    Pertinent History Pt has a history of chronic low back pain and L hip soreness intermittently for years. When her L hip first started hurting it would occur at night and wake her up. At least 5 years ago the hip pain started to worsen however due to [place] she delayed seeking care. She did see her PCP in [place] who suggested a consult for a L hip replacement however pt would like to avoid surgery. She saw Dr. Zigmund Daniel who performed plain film radiographs and per medical record independent interpretation of lumbar x-ray films dated 07/02/2021 revealed overall maintained intervertebral space, anterior endplate osteophytes noted at L5, L4, L3 inferiorly, and L1, facet  hypertrophy noted at the L4-5 articulation. Independent interpretation of left hip X-ray dated 07/02/2021 reveals moderate right and moderate to severe left hip osteoarthritis with subchondral sclerosis, deformation of the femoral head on the left, and acetabular osteophytes, no acute osseous process identified. Based on imaging and physical exam Dr. 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 She was prescribed duloxetine, diclofenac, and the muscle relaxers which have helped with her pain. Pt was referred to PT to focus on attaining maximal range of motion and improving strength. Pt reports that she heavily favors her RLE when standing. 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 She denies and LE weakness, buckling, or paresthesias.    Limitations Sitting;Standing    Diagnostic tests see history    Patient Stated Goals "I want to start walking and lose weight/get in shape" Decrease L hip pain    Currently in Pain? No/denies                  TREATMENT   Manual Therapy  NuStep L2-5 x 5 minutes for warm-up and L hip AAROM during interval history; Prone L1-L5 grade III mobilizations, 20s/bout  x 1 bout/level; Prone left hip internal and external rotation as well as quad stretching 2 x 60 seconds each; Prone posterior to anterior femur on pelvis mobilizations, grade II-III, 30s/bout x 3 with 1/2 foam roll under distal thigh to improve hip extension; Supine L hip FABER stretch 2 x 60s; Supine L hip belt assisted moblizations, inferior, grade II-III, 30s/bout x 3 bouts; Supine L hip belt assisted moblizations, inferior with L hip at 90 degrees of flexion, grade II-III, 30s/bout x 3 bouts;   Ther-ex  Standing L hip flexor step stretch 60s x 3; Total Gym (TG) Level  22 (L22) partial squats 2 x 20; TG L22 double heel raises 2 x 20; Prone straight knee hip extension 2 x 10; Prone HS curls with manual resistance from therapist 2 x 10; Supine L SLR x 10; Hooklying bridges x 10 (attempted single leg bridge but pt unable to achieve adequate hip extension);   Pt educated throughout session about proper posture and technique with exercises. Improved exercise technique, movement at target joints, use of target muscles after min to mod verbal, visual, tactile cues.    Patient demonstrates excellent motivation during session today. She reports resolution of her post hip injection symptoms.  Continued with light range of motion, stretching, and hip mobilizations for improved hip mobility.  Also continued with exercises for left lower extremity strengthening.  She continues to demonstrate improvement in her upright posture.  Patient has started walking again for exercise and would like to know if she can decrease her therapy frequency to once per week.  Therapist agrees to decrease frequency to once per week with encouragement for patient to be diligent about her home exercise program.  Patient encouraged to follow-up as scheduled. She will benefit from PT services to address deficits in strength, range of motion, and mobility in order to return to full function at home.                         PT Short Term Goals - 10/30/21 1351       PT SHORT TERM GOAL #1   Title Pt will be independent with HEP in order to improve strength and decrease L hip pain in order to improve pain-free function at home, when painting, and with exercise    Time 4    Period Weeks    Status Partially Met    Target Date 11/27/21      PT SHORT TERM GOAL #2   Title Pt will decrease worst L hip pain as reported on NPRS by at least 3 points in order to demonstrate clinically significant reduction in hip pain.    Baseline 08/28/21: worst: 10/10; 10/11/21: 3/10;    Time 8     Period Weeks    Status Achieved               PT Long Term Goals - 10/30/21 1351       PT LONG TERM GOAL #1   Title Pt will increase FOTO to at least 58 in order to demonstrate significant improvement in function related to L hip pain    Baseline 08/28/21: 40; 10/11/21: 60    Time 8    Period Weeks    Status Achieved      PT LONG TERM GOAL #2   Title Pt will decrease worst pain as reported on NPRS to no greater than 4/10 in order to demonstrate clinically significant reduction in pain.  Baseline 08/28/21: worst: 10/10; 10/11/21; 3/10;    Time 8    Period Weeks    Status Achieved      PT LONG TERM GOAL #3   Title Pt will increase strength of  L hip abduction and extension to at least 4/5 in order to demonstrate improvement in strength and function.    Baseline 08/28/21: L hip abduction: 4-/5, extension: <3/5; 10/11/21: L hip abduction: 4-/5, extension: 3+/5;    Time 8    Period Weeks    Status Partially Met                   Plan - 11/01/21 1113     Clinical Impression Statement Patient demonstrates excellent motivation during session today. She reports resolution of her post hip injection symptoms.  Continued with light range of motion, stretching, and hip mobilizations for improved hip mobility.  Also continued with exercises for left lower extremity strengthening.  She continues to demonstrate improvement in her upright posture.  Patient has started walking again for exercise and would like to know if she can decrease her therapy frequency to once per week.  Therapist agrees to decrease frequency to once per week with encouragement for patient to be diligent about her home exercise program.  Patient encouraged to follow-up as scheduled. She will benefit from PT services to address deficits in strength, range of motion, and mobility in order to return to full function at home.    Personal Factors and Comorbidities Age;Comorbidity 1;Time since onset of  injury/illness/exacerbation    Comorbidities OA    Examination-Activity Limitations [place]    Examination-Participation Restrictions Community Activity;Occupation    Stability/Clinical Decision Making Stable/Uncomplicated    Rehab Potential Good    PT Frequency 2x / week    PT Duration 8 weeks    PT Treatment/Interventions ADLs/Self Care Home Management;Aquatic Therapy;Biofeedback;Canalith Repostioning;Iontophoresis 14m/ml Dexamethasone;Moist Heat;Electrical Stimulation;Cryotherapy;Traction;Ultrasound;Gait training;Stair training;Functional mobility training;Therapeutic activities;Therapeutic exercise;Neuromuscular re-education;Manual techniques;Passive range of motion;Dry needling;Vestibular;Spinal Manipulations;Joint Manipulations    PT Next Visit Plan Continue with manual techniques and stretching, progress HEP;    PT Home Exercise Plan Access Code: [email or web address] Prone positioning regularly throughout the day with gradually increasing frequency and duration    Consulted and Agree with Plan of Care Patient              Patient will benefit from skilled therapeutic intervention in order to improve the following deficits and impairments:  Pain, Decreased strength, Decreased range of motion, Impaired flexibility  Visit Diagnosis: Pain in left hip  Muscle weakness (generalized)  Other abnormalities of gait and mobility     Problem List Patient Active Problem List   Diagnosis Date Noted   Primary osteoarthritis of left hip 08/13/2021   Muscular deconditioning 08/13/2021   Spondylosis of lumbosacral region without myelopathy or radiculopathy 08/13/2021   Mixed hyperlipidemia 07/26/2021   Primary osteoarthritis involving multiple joints 06/29/2021   JPhillips GroutPT, DPT, GCS  Huprich,Jason, PT 11/02/2021, 1:46 PM  [facility] [place] [address]Phone: [phone]  Fax:  [phone] Name: Courtney STEELEMRN: [id number] of Birth: 109/07/1956

## 2021-11-08 ENCOUNTER — Ambulatory Visit: Payer: Medicare Other

## 2021-11-08 ENCOUNTER — Other Ambulatory Visit: Payer: Self-pay

## 2021-11-08 DIAGNOSIS — M6281 Muscle weakness (generalized): Secondary | ICD-10-CM

## 2021-11-08 DIAGNOSIS — M25552 Pain in left hip: Secondary | ICD-10-CM | POA: Diagnosis not present

## 2021-11-08 NOTE — Therapy (Signed)
[place] [facility] [address] [place], [place], [address] Phone: [phone]   Fax:  [phone]  Physical Therapy Treatment   Patient Details  Name: Courtney Cervantes MRN: [id number] Date of Birth: Apr 17, 1956 Referring Provider (PT): Dr. Zigmund Daniel   Encounter Date: 11/08/2021   PT End of Session - 11/08/21 1146     Visit Number 16    Number of Visits 33    Date for PT Re-Evaluation 12/25/21    Authorization Type Medicare    PT Start Time 1100    PT Stop Time 1145    PT Time Calculation (min) 45 min    Activity Tolerance Patient tolerated treatment well    Behavior During Therapy [facility] for tasks assessed/performed             Past Medical History:  Diagnosis Date   Arthritis     Past Surgical History:  Procedure Laterality Date   [place] Bilateral [place]    There were no vitals filed for this visit.    Subjective Assessment - 11/08/21 1141     Subjective Pt reports that she is doing well today. She denies any hip pain upon arrival.  She has not been very consistent about prone positioning for stretches at home.  She has started walking regularly.  No specific questions or concerns upon arrival.    Pertinent History Pt has a history of chronic low back pain and L hip soreness intermittently for years. When her L hip first started hurting it would occur at night and wake her up. At least 5 years ago the hip pain started to worsen however due to [place] she delayed seeking care. She did see her PCP in [place] who suggested a consult for a L hip replacement however pt would like to avoid surgery. She saw Dr. Zigmund Daniel who performed plain film radiographs and per medical record independent interpretation of lumbar x-ray films dated 07/02/2021 revealed overall maintained intervertebral space, anterior endplate osteophytes noted at L5, L4, L3 inferiorly, and L1, facet  hypertrophy noted at the L4-5 articulation. Independent interpretation of left hip X-ray dated 07/02/2021 reveals moderate right and moderate to severe left hip osteoarthritis with subchondral sclerosis, deformation of the femoral head on the left, and acetabular osteophytes, no acute osseous process identified. Based on imaging and physical exam Dr. 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 She was prescribed duloxetine, diclofenac, and the muscle relaxers which have helped with her pain. Pt was referred to PT to focus on attaining maximal range of motion and improving strength. Pt reports that she heavily favors her RLE when standing. 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 She denies and LE weakness, buckling, or paresthesias.    Limitations Sitting;Standing    Diagnostic tests see history    Patient Stated Goals "I want to start walking and lose weight/get in shape" Decrease L hip pain    Currently in Pain? No/denies                 TREATMENT   Manual Therapy  NuStep L2-3 x 5 minutes for warm-up and L hip AAROM during interval history; Prone L1-L5 grade III mobilizations, 20s/bout x 2 bout/level; Prone left  hip internal and external rotation as well as quad stretching 2 x 60 seconds each; Prone posterior to anterior femur on pelvis mobilizations, grade II-III, 30s/bout x 5 bouts with 1/2 foam roll under distal thigh to improve hip extension; Education regarding importance of prone positioning as well as HEP review;   Ther-ex  Standing L hip flexor step stretch 60s x 3; Prone L HS curls with manual resistance from therapist 2 x 10; Supine L SLR 2 x 10; Hooklying bridges 2 x 10;   Pt educated throughout session about proper posture and technique  with exercises. Improved exercise technique, movement at target joints, use of target muscles after min to mod verbal, visual, tactile cues.    Patient demonstrates excellent motivation during session today. Continued with light range of motion, stretching, and hip mobilizations for improved hip mobility.  Also continued with exercises for left lower extremity strengthening.  She continues to demonstrate improvement in her upright posture.  Patient encouraged to continue walking but also to be consistent with her prone hip flexor stretches.  Patient encouraged to follow-up as scheduled. She will benefit from PT services to address deficits in strength, range of motion, and mobility in order to return to full function at home.                         PT Short Term Goals - 10/30/21 1351       PT SHORT TERM GOAL #1   Title Pt will be independent with HEP in order to improve strength and decrease L hip pain in order to improve pain-free function at home, when painting, and with exercise    Time 4    Period Weeks    Status Partially Met    Target Date 11/27/21      PT SHORT TERM GOAL #2   Title Pt will decrease worst L hip pain as reported on NPRS by at least 3 points in order to demonstrate clinically significant reduction in hip pain.    Baseline 08/28/21: worst: 10/10; 10/11/21: 3/10;    Time 8    Period Weeks    Status Achieved               PT Long Term Goals - 10/30/21 1351       PT LONG TERM GOAL #1   Title Pt will increase FOTO to at least 58 in order to demonstrate significant improvement in function related to L hip pain    Baseline 08/28/21: 40; 10/11/21: 60    Time 8    Period Weeks    Status Achieved      PT LONG TERM GOAL #2   Title Pt will decrease worst pain as reported on NPRS to no greater than 4/10 in order to demonstrate clinically significant reduction in pain.    Baseline 08/28/21: worst: 10/10; 10/11/21; 3/10;    Time 8    Period Weeks     Status Achieved      PT LONG TERM GOAL #3   Title Pt will increase strength of  L hip abduction and extension to at least 4/5 in order to demonstrate improvement in strength and function.    Baseline 08/28/21: L hip abduction: 4-/5, extension: <3/5; 10/11/21: L hip abduction: 4-/5, extension: 3+/5;    Time 8    Period Weeks    Status Partially Met  Plan - 11/08/21 1145     Clinical Impression Statement Patient demonstrates excellent motivation during session today. Continued with light range of motion, stretching, and hip mobilizations for improved hip mobility.  Also continued with exercises for left lower extremity strengthening.  She continues to demonstrate improvement in her upright posture.  Patient encouraged to continue walking but also to be consistent with her prone hip flexor stretches.  Patient encouraged to follow-up as scheduled. She will benefit from PT services to address deficits in strength, range of motion, and mobility in order to return to full function at home.    Personal Factors and Comorbidities Age;Comorbidity 1;Time since onset of injury/illness/exacerbation    Comorbidities OA    Examination-Activity Limitations [place]    Examination-Participation Restrictions Community Activity;Occupation    Stability/Clinical Decision Making Stable/Uncomplicated    Rehab Potential Good    PT Frequency 2x / week    PT Duration 8 weeks    PT Treatment/Interventions ADLs/Self Care Home Management;Aquatic Therapy;Biofeedback;Canalith Repostioning;Iontophoresis 85m/ml Dexamethasone;Moist Heat;Electrical Stimulation;Cryotherapy;Traction;Ultrasound;Gait training;Stair training;Functional mobility training;Therapeutic activities;Therapeutic exercise;Neuromuscular re-education;Manual techniques;Passive range of motion;Dry needling;Vestibular;Spinal Manipulations;Joint Manipulations    PT Next Visit Plan Continue with manual techniques and stretching, progress HEP;     PT Home Exercise Plan Access Code: [email or web address] Prone positioning regularly throughout the day with gradually increasing frequency and duration    Consulted and Agree with Plan of Care Patient              Patient will benefit from skilled therapeutic intervention in order to improve the following deficits and impairments:  Pain, Decreased strength, Decreased range of motion, Impaired flexibility  Visit Diagnosis: Pain in left hip  Muscle weakness (generalized)     Problem List Patient Active Problem List   Diagnosis Date Noted   Primary osteoarthritis of left hip 08/13/2021   Muscular deconditioning 08/13/2021   Spondylosis of lumbosacral region without myelopathy or radiculopathy 08/13/2021   Mixed hyperlipidemia 07/26/2021   Primary osteoarthritis involving multiple joints 06/29/2021   JPhillips GroutPT, DPT, GCS  Elver Stadler, PT 11/09/2021, 8:38 AM  [place] [facility] [place] [address]Phone: [phone]  Fax:  [phone] Name: Courtney SCHRECENGOSTMRN: [id number] of Birth: [birth year]

## 2021-11-15 ENCOUNTER — Ambulatory Visit: Payer: Medicare Other

## 2021-11-22 ENCOUNTER — Other Ambulatory Visit: Payer: Self-pay

## 2021-11-22 ENCOUNTER — Ambulatory Visit: Payer: Medicare Other

## 2021-11-22 DIAGNOSIS — M25552 Pain in left hip: Secondary | ICD-10-CM | POA: Diagnosis not present

## 2021-11-22 DIAGNOSIS — M6281 Muscle weakness (generalized): Secondary | ICD-10-CM

## 2021-11-22 NOTE — Therapy (Signed)
[place] [facility] [address] [place], [place], [address] Phone: [phone]   Fax:  [phone]  Physical Therapy Treatment   Patient Details  Name: Courtney Cervantes MRN: [id number] Date of Birth: [birth year] Referring Provider (PT): Dr. Zigmund Daniel    Encounter Date: 11/22/2021   PT End of Session - 11/22/21 1110     Visit Number 17    Number of Visits 33    Date for PT Re-Evaluation 12/25/21    Authorization Type Medicare    PT Start Time 1104    PT Stop Time 1145    PT Time Calculation (min) 41 min    Activity Tolerance Patient tolerated treatment well    Behavior During Therapy [facility] for tasks assessed/performed             Past Medical History:  Diagnosis Date   Arthritis     Past Surgical History:  Procedure Laterality Date   [place] Bilateral [place]    There were no vitals filed for this visit.    Subjective Assessment - 11/22/21 1108     Subjective Pt reports that she is doing well today. She denies any hip pain upon arrival.  She has been inconsistent with her walking but reports improvement in her hip range of motion and is now able to cross her left leg over her right leg and sitting. No specific questions or concerns upon arrival.    Pertinent History Pt has a history of chronic low back pain and L hip soreness intermittently for years. When her L hip first started hurting it would occur at night and wake her up. At least 5 years ago the hip pain started to worsen however due to [place] she delayed seeking care. She did see her PCP in [place] who suggested a consult for a L hip replacement however pt would like to avoid surgery. She saw Dr. Zigmund Daniel who performed plain film radiographs and per medical record independent interpretation of lumbar x-ray films dated 07/02/2021 revealed overall maintained intervertebral space, anterior endplate osteophytes noted  at L5, L4, L3 inferiorly, and L1, facet hypertrophy noted at the L4-5 articulation. Independent interpretation of left hip X-ray dated 07/02/2021 reveals moderate right and moderate to severe left hip osteoarthritis with subchondral sclerosis, deformation of the femoral head on the left, and acetabular osteophytes, no acute osseous process identified. Based on imaging and physical exam Dr. 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 She was prescribed duloxetine, diclofenac, and the muscle relaxers which have helped with her pain. Pt was referred to PT to focus on attaining maximal range of motion and improving strength. Pt reports that she heavily favors her RLE when standing. 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 She denies and LE weakness, buckling, or paresthesias.    Limitations Sitting;Standing    Diagnostic tests see history    Patient Stated Goals "I want to start walking and lose weight/get in shape" Decrease L hip pain    Currently in Pain? No/denies                 TREATMENT   Manual Therapy  NuStep L2-4 x 5 minutes for warm-up and L hip AAROM during interval  history; Prone L1-L5 grade III mobilizations, 20s/bout x 1 bout/level; Prone left hip internal and external rotation as well as quad stretching 2 x 60 seconds each; Prone posterior to anterior femur on pelvis mobilizations, grade II-III, 30s/bout x 5 bouts with 1/2 foam roll under distal thigh to improve hip extension; Supine left hip internal and external rotation stretching x60 seconds each; Supine L hip belt assisted moblizations, medial to lateral, grade II-III, 30s/bout x 3 bouts; Supine L hip belt assisted moblizations, inferior with L hip in FABER position, grade II-III, 30s/bout x 3  bouts;   Ther-ex  Standing L hip flexor step stretch 60s x 3; Prone L HS curls with manual resistance from therapist 2 x 10; Supine L SLR 2 x 10; Hooklying bridges x 10;   Pt educated throughout session about proper posture and technique with exercises. Improved exercise technique, movement at target joints, use of target muscles after min to mod verbal, visual, tactile cues.    Patient demonstrates excellent motivation during session today. Continued with light range of motion, stretching, and hip mobilizations for improved hip mobility.  Also continued with exercises for left lower extremity strengthening.  She continues to demonstrate improvement in her upright posture.  Patient encouraged to continue walking but also to be consistent with her prone hip flexor stretches.  Therapist will update her outcome measures and goals the next session prior to her return appointment with Dr. Zigmund Daniel.  Patient encouraged to follow-up as scheduled. She will benefit from PT services to address deficits in strength, range of motion, and mobility in order to return to full function at home.                         PT Short Term Goals - 10/30/21 1351       PT SHORT TERM GOAL #1   Title Pt will be independent with HEP in order to improve strength and decrease L hip pain in order to improve pain-free function at home, when painting, and with exercise    Time 4    Period Weeks    Status Partially Met    Target Date 11/27/21      PT SHORT TERM GOAL #2   Title Pt will decrease worst L hip pain as reported on NPRS by at least 3 points in order to demonstrate clinically significant reduction in hip pain.    Baseline 08/28/21: worst: 10/10; 10/11/21: 3/10;    Time 8    Period Weeks    Status Achieved               PT Long Term Goals - 10/30/21 1351       PT LONG TERM GOAL #1   Title Pt will increase FOTO to at least 58 in order to demonstrate significant improvement in  function related to L hip pain    Baseline 08/28/21: 40; 10/11/21: 60    Time 8    Period Weeks    Status Achieved      PT LONG TERM GOAL #2   Title Pt will decrease worst pain as reported on NPRS to no greater than 4/10 in order to demonstrate clinically significant reduction in pain.    Baseline 08/28/21: worst: 10/10; 10/11/21; 3/10;    Time 8    Period Weeks    Status Achieved      PT LONG TERM GOAL #3   Title Pt will increase strength of  L hip abduction and  extension to at least 4/5 in order to demonstrate improvement in strength and function.    Baseline 08/28/21: L hip abduction: 4-/5, extension: <3/5; 10/11/21: L hip abduction: 4-/5, extension: 3+/5;    Time 8    Period Weeks    Status Partially Met                   Plan - 11/22/21 1111     Clinical Impression Statement Patient demonstrates excellent motivation during session today. Continued with light range of motion, stretching, and hip mobilizations for improved hip mobility.  Also continued with exercises for left lower extremity strengthening.  She continues to demonstrate improvement in her upright posture.  Patient encouraged to continue walking but also to be consistent with her prone hip flexor stretches.  Therapist will update her outcome measures and goals the next session prior to her return appointment with Dr. Zigmund Daniel.  Patient encouraged to follow-up as scheduled. She will benefit from PT services to address deficits in strength, range of motion, and mobility in order to return to full function at home.    Personal Factors and Comorbidities Age;Comorbidity 1;Time since onset of injury/illness/exacerbation    Comorbidities OA    Examination-Activity Limitations [place]    Examination-Participation Restrictions Community Activity;Occupation    Stability/Clinical Decision Making Stable/Uncomplicated    Rehab Potential Good    PT Frequency 2x / week    PT Duration 8 weeks    PT Treatment/Interventions  ADLs/Self Care Home Management;Aquatic Therapy;Biofeedback;Canalith Repostioning;Iontophoresis 34m/ml Dexamethasone;Moist Heat;Electrical Stimulation;Cryotherapy;Traction;Ultrasound;Gait training;Stair training;Functional mobility training;Therapeutic activities;Therapeutic exercise;Neuromuscular re-education;Manual techniques;Passive range of motion;Dry needling;Vestibular;Spinal Manipulations;Joint Manipulations    PT Next Visit Plan Continue with manual techniques and stretching, progress HEP;    PT Home Exercise Plan Access Code: [email or web address] Prone positioning regularly throughout the day with gradually increasing frequency and duration    Consulted and Agree with Plan of Care Patient              Patient will benefit from skilled therapeutic intervention in order to improve the following deficits and impairments:  Pain, Decreased strength, Decreased range of motion, Impaired flexibility  Visit Diagnosis: Pain in left hip  Muscle weakness (generalized)     Problem List Patient Active Problem List   Diagnosis Date Noted   Primary osteoarthritis of left hip 08/13/2021   Muscular deconditioning 08/13/2021   Spondylosis of lumbosacral region without myelopathy or radiculopathy 08/13/2021   Mixed hyperlipidemia 07/26/2021   Primary osteoarthritis involving multiple joints 06/29/2021   JPhillips GroutPT, DPT, GCS  Ell Tiso, PT 11/22/2021, 1:01 PM  [place] [facility] [place] [address]Phone: [phone]  Fax:  [phone] Name: Courtney WISLERMRN: [id number] of Birth: 112/07/1956

## 2021-11-29 ENCOUNTER — Other Ambulatory Visit: Payer: Self-pay

## 2021-11-29 ENCOUNTER — Ambulatory Visit: Payer: Medicare Other

## 2021-11-29 DIAGNOSIS — M6281 Muscle weakness (generalized): Secondary | ICD-10-CM

## 2021-11-29 DIAGNOSIS — M25552 Pain in left hip: Secondary | ICD-10-CM | POA: Diagnosis not present

## 2021-11-29 NOTE — Therapy (Signed)
[facility] [address] [place], [place], [address] Phone: [phone]   Fax:  [phone]  Physical Therapy Treatment   Patient Details  Name: Courtney Cervantes MRN: [id number] Date of Birth: June 22, 1956 Referring Provider (PT): Dr. Zigmund Daniel    Encounter Date: 11/29/2021   PT End of Session - 11/29/21 1102     Visit Number 18    Number of Visits 33    Date for PT Re-Evaluation 12/25/21    Authorization Type Medicare    PT Start Time 1100    PT Stop Time 1145    PT Time Calculation (min) 45 min    Activity Tolerance Patient tolerated treatment well    Behavior During Therapy [facility] for tasks assessed/performed             Past Medical History:  Diagnosis Date   Arthritis     Past Surgical History:  Procedure Laterality Date   [place] Bilateral [place]    There were no vitals filed for this visit.    Subjective Assessment - 11/29/21 1058     Subjective Pt reports that she is doing well today. She denies any hip pain upon arrival but has been very sore over the last two days. No specific concerns upon arrival.    Pertinent History Pt has a history of chronic low back pain and L hip soreness intermittently for years. When her L hip first started hurting it would occur at night and wake her up. At least 5 years ago the hip pain started to worsen however due to [place] she delayed seeking care. She did see her PCP in [place] who suggested a consult for a L hip replacement however pt would like to avoid surgery. She saw Dr. Zigmund Daniel who performed plain film radiographs and per medical record independent interpretation of lumbar x-ray films dated 07/02/2021 revealed overall maintained intervertebral space, anterior endplate osteophytes noted at L5, L4, L3 inferiorly, and L1, facet hypertrophy noted at the L4-5 articulation. Independent interpretation of left hip X-ray  dated 07/02/2021 reveals moderate right and moderate to severe left hip osteoarthritis with subchondral sclerosis, deformation of the femoral head on the left, and acetabular osteophytes, no acute osseous process identified. Based on imaging and physical exam Dr. 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 She was prescribed duloxetine, diclofenac, and the muscle relaxers which have helped with her pain. Pt was referred to PT to focus on attaining maximal range of motion and improving strength. Pt reports that she heavily favors her RLE when standing. 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 She denies and LE weakness, buckling, or paresthesias.    Limitations Sitting;Standing    Diagnostic tests see history    Patient Stated Goals "I want to start walking and lose weight/get in shape" Decrease L hip pain    Currently in Pain? No/denies                 TREATMENT   Manual Therapy  NuStep L2-4 x 6 minutes for warm-up and L hip AAROM during interval history; Prone L1-L5 grade III mobilizations, 20s/bout x 1 bout/level; Prone left hip internal and external rotation as well as quad stretching 2 x  60 seconds each; Prone posterior to anterior femur on pelvis mobilizations, grade II-III, 30s/bout x 5 bouts with 1/2 foam roll under distal thigh to improve hip extension; Supine left hip internal and external rotation stretching x60 seconds each; Supine L hip belt assisted moblizations, medial to lateral, grade II-III, 30s/bout x 3 bouts; Supine L hip belt assisted moblizations, inferior with L hip in FABER position, grade II-III, 30s/bout x 3 bouts;   Ther-ex  Standing L hip flexor step stretch 60s x 3; 6" forward step-ups with BUE support on handrail, leading with  LLE x 10; 6" R lateral heel taps with BUE support on handrail x 10;\ Supine L SLR x 10; Hooklying bridges x 10; Hooklying L single leg figure 4 bridges x 10;   Pt educated throughout session about proper posture and technique with exercises. Improved exercise technique, movement at target joints, use of target muscles after min to mod verbal, visual, tactile cues.    Patient demonstrates excellent motivation during session today. Continued with light range of motion, stretching, and hip mobilizations for improved hip mobility.  Also continued with exercises for left lower extremity strengthening.  She continues to demonstrate improvement in her upright posture.  Patient encouraged to continue walking but also to be consistent with her prone hip flexor stretches.  Pt has a return appointment with Dr. Zigmund Daniel and they will discuss whether she needs additional therapy or not. Pt to call back to notify therapy after her appointment with Dr. Zigmund Daniel.                          PT Short Term Goals - 10/30/21 1351       PT SHORT TERM GOAL #1   Title Pt will be independent with HEP in order to improve strength and decrease L hip pain in order to improve pain-free function at home, when painting, and with exercise    Time 4    Period Weeks    Status Partially Met    Target Date 11/27/21      PT SHORT TERM GOAL #2   Title Pt will decrease worst L hip pain as reported on NPRS by at least 3 points in order to demonstrate clinically significant reduction in hip pain.    Baseline 08/28/21: worst: 10/10; 10/11/21: 3/10;    Time 8    Period Weeks    Status Achieved               PT Long Term Goals - 11/30/21 1122       PT LONG TERM GOAL #1   Title Pt will increase FOTO to at least 58 in order to demonstrate significant improvement in function related to L hip pain    Baseline 08/28/21: 40; 10/11/21: 60    Time 8    Period Weeks    Status Achieved      PT LONG TERM  GOAL #2   Title Pt will decrease worst pain as reported on NPRS to no greater than 4/10 in order to demonstrate clinically significant reduction in pain.    Baseline 08/28/21: worst: 10/10; 10/11/21; 3/10;    Time 8    Period Weeks    Status Achieved      PT LONG TERM GOAL #3   Title Pt will increase strength of  L hip abduction and extension to at least 4/5 in order to demonstrate improvement in strength and function.    Baseline 08/28/21: L hip  abduction: 4-/5, extension: <3/5; 10/11/21: L hip abduction: 4-/5, extension: 3+/5;    Time 8    Period Weeks    Status Partially Met                   Plan - 11/29/21 1106     Clinical Impression Statement Patient demonstrates excellent motivation during session today. Continued with light range of motion, stretching, and hip mobilizations for improved hip mobility.  Also continued with exercises for left lower extremity strengthening.  She continues to demonstrate improvement in her upright posture.  Patient encouraged to continue walking but also to be consistent with her prone hip flexor stretches.  Pt has a return appointment with Dr. Zigmund Daniel and they will discuss whether she needs additional therapy or not. Pt to call back to notify therapy after her appointment with Dr. Zigmund Daniel.    Personal Factors and Comorbidities Age;Comorbidity 1;Time since onset of injury/illness/exacerbation    Comorbidities OA    Examination-Activity Limitations [place]    Examination-Participation Restrictions Community Activity;Occupation    Stability/Clinical Decision Making Stable/Uncomplicated    Rehab Potential Good    PT Frequency 2x / week    PT Duration 8 weeks    PT Treatment/Interventions ADLs/Self Care Home Management;Aquatic Therapy;Biofeedback;Canalith Repostioning;Iontophoresis 23m/ml Dexamethasone;Moist Heat;Electrical Stimulation;Cryotherapy;Traction;Ultrasound;Gait training;Stair training;Functional mobility training;Therapeutic  activities;Therapeutic exercise;Neuromuscular re-education;Manual techniques;Passive range of motion;Dry needling;Vestibular;Spinal Manipulations;Joint Manipulations    PT Next Visit Plan Continue with manual techniques and stretching, progress HEP;    PT Home Exercise Plan Access Code: [email or web address] Prone positioning regularly throughout the day with gradually increasing frequency and duration    Consulted and Agree with Plan of Care Patient              Patient will benefit from skilled therapeutic intervention in order to improve the following deficits and impairments:  Pain, Decreased strength, Decreased range of motion, Impaired flexibility  Visit Diagnosis: Pain in left hip  Muscle weakness (generalized)     Problem List Patient Active Problem List   Diagnosis Date Noted   Primary osteoarthritis of left hip 08/13/2021   Muscular deconditioning 08/13/2021   Spondylosis of lumbosacral region without myelopathy or radiculopathy 08/13/2021   Mixed hyperlipidemia 07/26/2021   Primary osteoarthritis involving multiple joints 06/29/2021   JPhillips GroutPT, DPT, GCS  Drexler Maland, PT 11/30/2021, 11:35 AM  [place] [facility] [place] [address]Phone: [phone]  Fax:  [phone] Name: Courtney STEHRMRN: [id number] of Birth: [birth year]

## 2021-12-05 ENCOUNTER — Other Ambulatory Visit: Payer: Self-pay

## 2021-12-05 ENCOUNTER — Ambulatory Visit (INDEPENDENT_AMBULATORY_CARE_PROVIDER_SITE_OTHER): Payer: Medicare Other | Admitting: Family Medicine

## 2021-12-05 ENCOUNTER — Encounter: Payer: Self-pay | Admitting: Family Medicine

## 2021-12-05 VITALS — BP 116/78 | HR 93 | Ht 69.0 in | Wt 208.0 lb

## 2021-12-05 DIAGNOSIS — M47817 Spondylosis without myelopathy or radiculopathy, lumbosacral region: Secondary | ICD-10-CM | POA: Diagnosis not present

## 2021-12-05 DIAGNOSIS — R29898 Other symptoms and signs involving the musculoskeletal system: Secondary | ICD-10-CM | POA: Diagnosis not present

## 2021-12-05 DIAGNOSIS — M1612 Unilateral primary osteoarthritis, left hip: Secondary | ICD-10-CM | POA: Diagnosis not present

## 2021-12-05 IMAGING — DX DG HIP (WITH OR WITHOUT PELVIS) 2-3V*L*
4 series · 4 of 4 positions shown · non-contrast
Comparison: X-ray lumbar spine dated November 01, 2006

CLINICAL DATA: Chronic low back and hip pain

EXAM:
DG HIP (WITH OR WITHOUT PELVIS) 2-3V LEFT; LUMBAR SPINE - COMPLETE
4+ VIEW

[pelvis ap (1 of 2)]
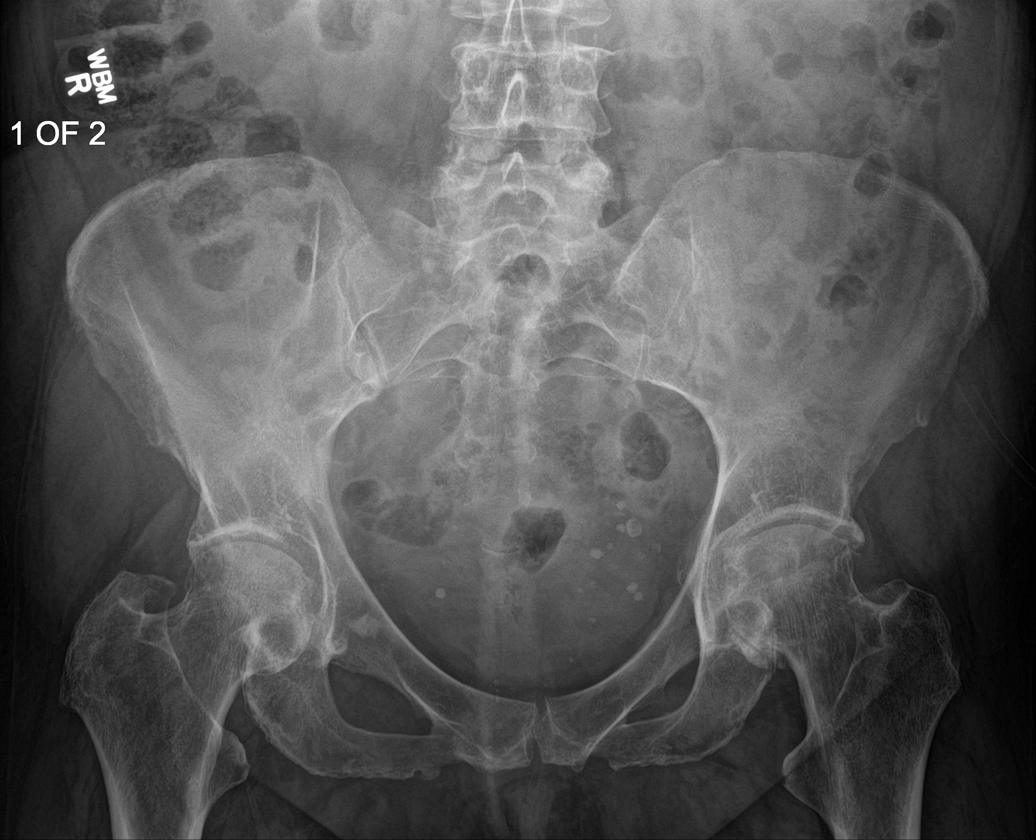

[hip ap]
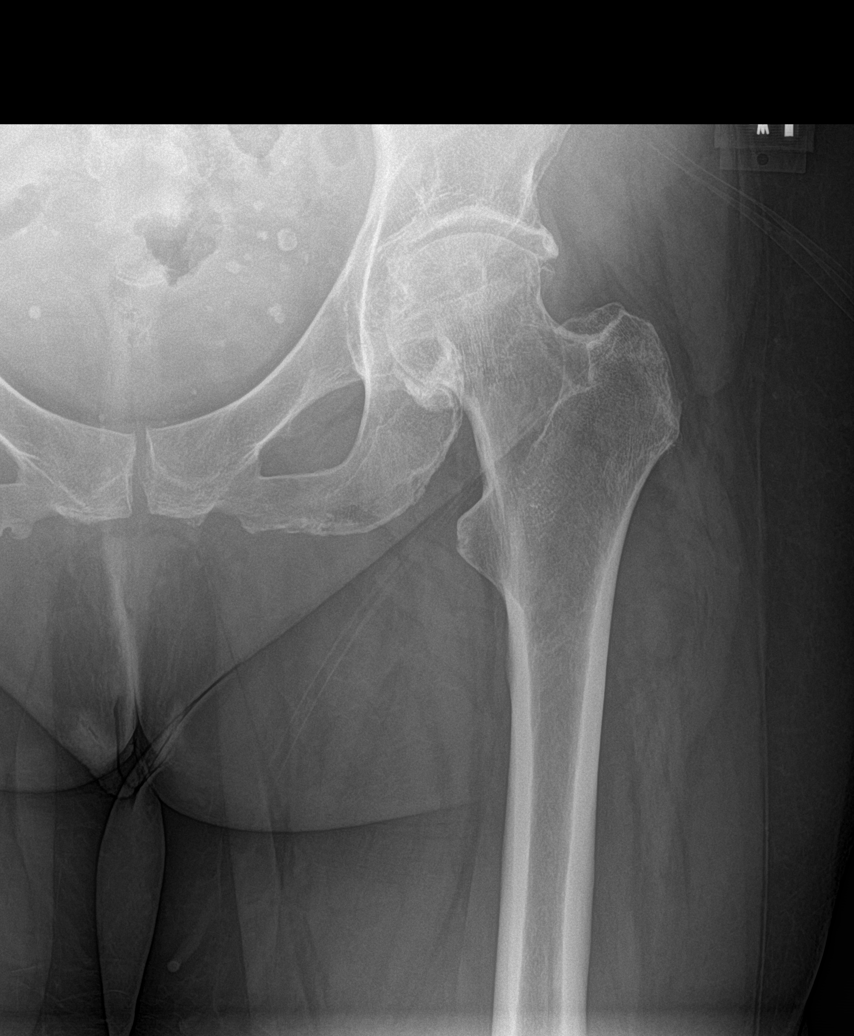

[hip lat]
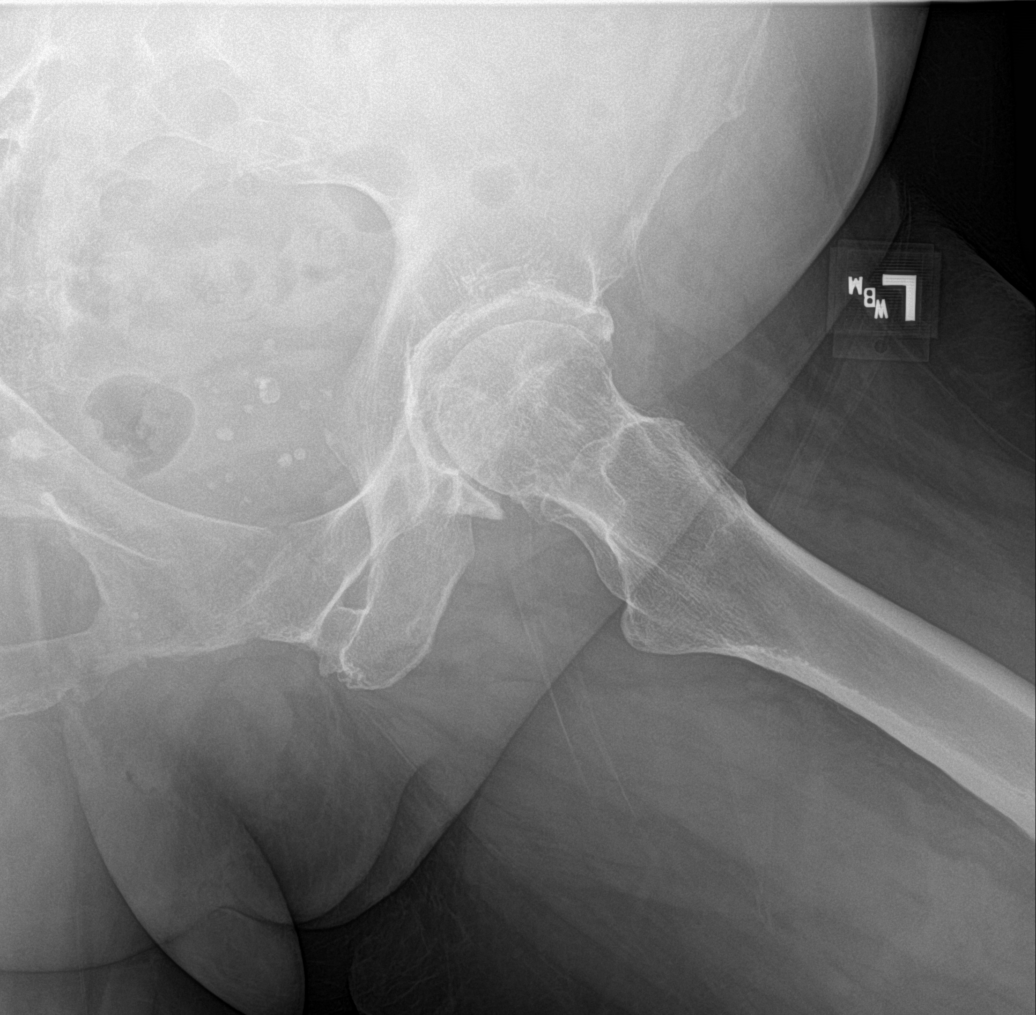

[pelvis ap (2 of 2)]
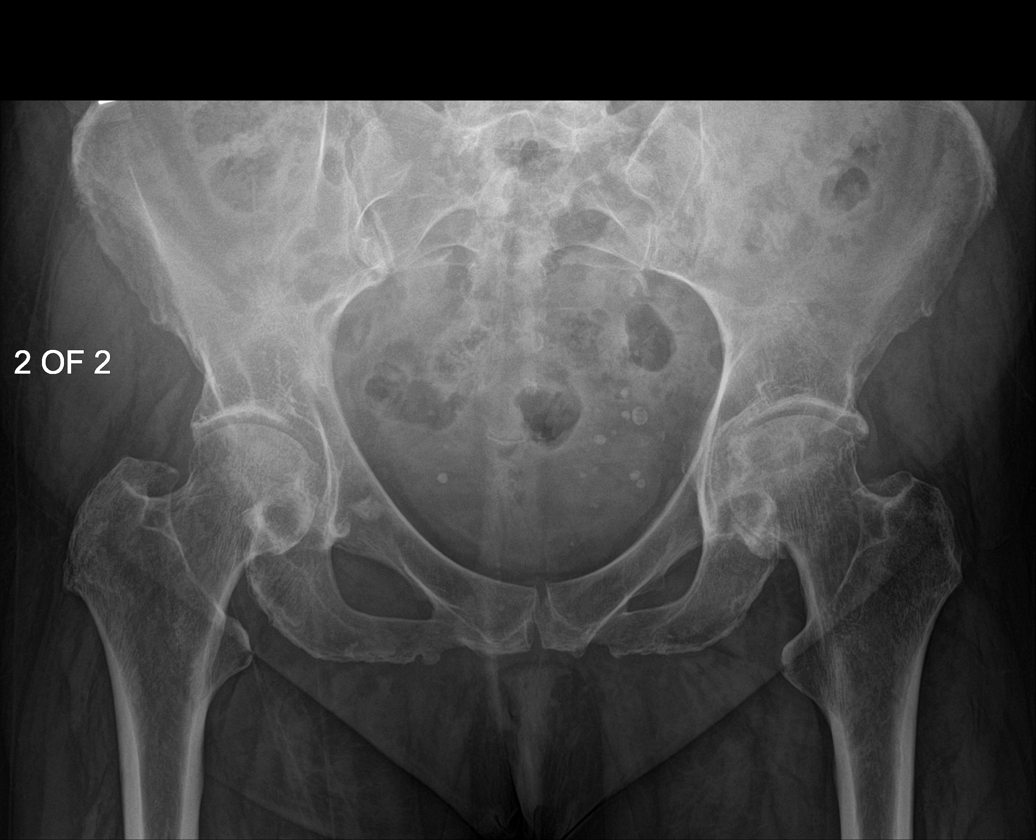

[4 of 4 positions shown; findings below may reference images not displayed]

FINDINGS: Lumbar spine: Five non-rib-bearing lumbar type vertebral bodies.
Vertebral body heights are well maintained. No evidence of acute
fracture. Normal alignment. Multilevel degenerative disc disease
consisting of mild osteophyte formation and preserved disc space
heights. Mild facet arthrosis, most pronounced in the lower lumbar
spine. Soft tissues are unremarkable.

Left hip: No acute fracture or dislocation. Moderate degenerative
changes of the left hip consisting of osteophyte formation, joint
space loss, and subchondral sclerosis. Bilateral SI joints and pubic
symphysis are unremarkable. Visualized sacrum is intact. Single AP
view of the right hip demonstrates moderate degenerative changes and
no acute osseous abnormality.
IMPRESSION: Mild spondylosis of the lumbar spine, increased compared to 4224
prior.

Moderate degenerative changes of the left hip.

## 2021-12-05 MED ORDER — DICLOFENAC SODIUM 50 MG PO TBEC: 50.0000 mg | DELAYED_RELEASE_TABLET | Freq: Two times a day (BID) | ORAL | 0 refills | Status: DC | PRN

## 2021-12-05 NOTE — Assessment & Plan Note (Signed)
Chronic issue that is now stable.  Low back symptoms have not directly been symptomatic though have been directly been related to generalized deconditioning and asymmetric hip arthralgia.  That being said, she has demonstrated excellent interval progress and continues to do so.  She has been transition to a fully home-based rehab program, wean from diclofenac over 2 weeks then reserve for as needed basis.  48-month follow-up for reevaluation.  Pending symptoms at that time, if suboptimal, further imaging of the lumbar spine for referral for injections to be coordinated.

## 2021-12-05 NOTE — Progress Notes (Signed)
°  ° °  Primary Care / Sports Medicine Office Visit  Patient Information:  Patient ID: Courtney Cervantes, female DOB: 1956/08/05 Age: 66 y.o. MRN: [id number]   Courtney Cervantes is a pleasant 66 y.o. female presenting with the following:  Chief Complaint  Patient presents with   Primary osteoarthritis of left hip    Following with PT; cortisone injection 10/22/21; taking Tylenol and diclofenac twice daily; soreness noted in the past week per patient; 1/10 pain    Patient Active Problem List   Diagnosis Date Noted   Primary osteoarthritis of left hip 08/13/2021   Muscular deconditioning 08/13/2021   Spondylosis of lumbosacral region without myelopathy or radiculopathy 08/13/2021   Mixed hyperlipidemia 07/26/2021   Primary osteoarthritis involving multiple joints 06/29/2021    Vitals:   12/05/21 0910  BP: 116/78  Pulse: 93  SpO2: 98%   Vitals:   12/05/21 0910  Weight: 208 lb (94.3 kg)  Height: 5\' 9"  (1.753 m)   Body mass index is 30.72 kg/m.  No results found.   Independent interpretation of notes and tests performed by another provider:   None  Procedures performed:   None  Pertinent History, Exam, Impression, and Recommendations:   Primary osteoarthritis of left hip Chronic condition that is now stable.  She has tolerated formal physical therapy and has made excellent progress with limitations noted due to underlying degenerative changes.  That being said, she denies any interference with ADLs, continues to make slow but steady progress at her examination is consistent with the same.  She does show clear limitations in hip mobilization, though they have demonstrated interval progress.  At this stage have advised patient to continue home-based maintenance exercises on a regular basis, gradually wean diclofenac which she is dosing twice daily to once daily x2 weeks then fully as needed.  She is to return for follow-up in 3 months for clinical reevaluation, pending  symptoms at that time can consider repeat corticosteroid injection or surgical options.  Muscular deconditioning Chronic issue that has stabilized/improving, patient has tolerated full physical therapy course and is now participating home-based exercises.  She has demonstrated improvement in her core/hip/low back regions.  Have encouraged her to continue regular maintenance exercises and maintain follow-up in 3 months.  Diclofenac to utilize as needed after 2 week wean.  Spondylosis of lumbosacral region without myelopathy or radiculopathy Chronic issue that is now stable.  Low back symptoms have not directly been symptomatic though have been directly been related to generalized deconditioning and asymmetric hip arthralgia.  That being said, she has demonstrated excellent interval progress and continues to do so.  She has been transition to a fully home-based rehab program, wean from diclofenac over 2 weeks then reserve for as needed basis.  32-month follow-up for reevaluation.  Pending symptoms at that time, if suboptimal, further imaging of the lumbar spine for referral for injections to be coordinated.   Orders & Medications Meds ordered this encounter  Medications   diclofenac (VOLTAREN) 50 MG EC tablet    Sig: Take 1 tablet (50 mg total) by mouth 2 (two) times daily as needed for moderate pain.    Dispense:  180 tablet    Refill:  0   No orders of the defined types were placed in this encounter.    Return in about 3 months (around 03/05/2022).     Montel Culver, MD   Primary Care Sports Medicine [place]

## 2021-12-05 NOTE — Assessment & Plan Note (Signed)
Chronic issue that has stabilized/improving, patient has tolerated full physical therapy course and is now participating home-based exercises.  She has demonstrated improvement in her core/hip/low back regions.  Have encouraged her to continue regular maintenance exercises and maintain follow-up in 3 months.  Diclofenac to utilize as needed after 2 week wean.

## 2021-12-05 NOTE — Patient Instructions (Addendum)
-   Transition to daily dosing of diclofenac x 2 weeks then fully as-needed dosing - Continue regular home exercises as instructed by PT - Return in 3 months for follow-up - Contact for questions, refills, etc

## 2021-12-05 NOTE — Assessment & Plan Note (Signed)
Chronic condition that is now stable.  She has tolerated formal physical therapy and has made excellent progress with limitations noted due to underlying degenerative changes.  That being said, she denies any interference with ADLs, continues to make slow but steady progress at her examination is consistent with the same.  She does show clear limitations in hip mobilization, though they have demonstrated interval progress.  At this stage have advised patient to continue home-based maintenance exercises on a regular basis, gradually wean diclofenac which she is dosing twice daily to once daily x2 weeks then fully as needed.  She is to return for follow-up in 3 months for clinical reevaluation, pending symptoms at that time can consider repeat corticosteroid injection or surgical options.

## 2021-12-07 ENCOUNTER — Other Ambulatory Visit: Payer: Self-pay | Admitting: Family Medicine

## 2021-12-07 DIAGNOSIS — M47817 Spondylosis without myelopathy or radiculopathy, lumbosacral region: Secondary | ICD-10-CM

## 2021-12-07 DIAGNOSIS — R29898 Other symptoms and signs involving the musculoskeletal system: Secondary | ICD-10-CM

## 2021-12-07 DIAGNOSIS — M1612 Unilateral primary osteoarthritis, left hip: Secondary | ICD-10-CM

## 2021-12-07 NOTE — Telephone Encounter (Signed)
Requested Prescriptions  Pending Prescriptions Disp Refills   diclofenac (VOLTAREN) 50 MG EC tablet [Pharmacy Med Name: DICLOFENAC SODIUM 50MG  DR TABLETS] 180 tablet 0    Sig: TAKE 1 TABLET(50 MG) BY MOUTH TWICE DAILY AS NEEDED FOR MODERATE PAIN     Analgesics:  NSAIDS Failed - 12/07/2021  3:17 AM      Failed - HGB in normal range and within 360 days    Hemoglobin  Date Value Ref Range Status  07/19/2021 16.2 (H) 11.7 - 15.5 g/dL Final         Passed - Cr in normal range and within 360 days    Creat  Date Value Ref Range Status  07/19/2021 0.88 0.50 - 1.05 mg/dL Final         Passed - Patient is not pregnant      Passed - Valid encounter within last 12 months    Recent Outpatient Visits          2 days ago Primary osteoarthritis of left hip   [facility] 07/21/2021, MD   1 month ago Primary osteoarthritis of left hip   [facility] Jerrol Banana, MD   2 months ago Muscular deconditioning   [facility] Jerrol Banana, MD   3 months ago Primary osteoarthritis of left hip   [facility] Jerrol Banana, MD   4 months ago Mixed hyperlipidemia   [facility], Althea Charon, DO      Future Appointments            In 1 month Netta Neat, Althea Charon, DO [facility], [facility]   In 2 months [facility], Ashley Royalty, MD [facility], [facility]            DULoxetine (CYMBALTA) 60 MG capsule [Pharmacy Med Name: DULOXETINE DR 60MG  CAPSULES] 90 capsule 0    Sig: TAKE 1 CAPSULE(60 MG) BY MOUTH DAILY     Psychiatry: Antidepressants - SNRI Passed - 12/07/2021  3:17 AM      Passed - Last BP in normal range    BP Readings from Last 1 Encounters:  12/05/21 116/78         Passed - Valid encounter within last 6 months    Recent Outpatient Visits          2 days ago Primary osteoarthritis of left hip   [facility] 02/04/2022, MD   1 month ago Primary osteoarthritis of left hip    [facility] 02/02/22, MD   2 months ago Muscular deconditioning   [facility] Jerrol Banana, MD   3 months ago Primary osteoarthritis of left hip   [facility] Jerrol Banana, MD   4 months ago Mixed hyperlipidemia   [facility], [facility], DO      Future Appointments            In 1 month [place], Netta Neat, DO [facility], [facility]   In 2 months Netta Neat, [facility], MD [facility], [facility]

## 2021-12-23 ENCOUNTER — Other Ambulatory Visit: Payer: Self-pay | Admitting: Family Medicine

## 2021-12-23 DIAGNOSIS — M47817 Spondylosis without myelopathy or radiculopathy, lumbosacral region: Secondary | ICD-10-CM

## 2021-12-23 DIAGNOSIS — M1612 Unilateral primary osteoarthritis, left hip: Secondary | ICD-10-CM

## 2021-12-23 DIAGNOSIS — R29898 Other symptoms and signs involving the musculoskeletal system: Secondary | ICD-10-CM

## 2021-12-23 NOTE — Telephone Encounter (Signed)
Med was dc'd 10/22/21 by Joseph Berkshire MD.  PE NT not delegated to refuse this med.   Requested Prescriptions  Pending Prescriptions Disp Refills   cyclobenzaprine (FLEXERIL) 10 MG tablet [Pharmacy Med Name: CYCLOBENZAPRINE 10MG  TABLETS] 90 tablet 0    Sig: TAKE 1 TABLET(10 MG) BY MOUTH AT BEDTIME AS NEEDED FOR MUSCLE SPASMS     Not Delegated - Analgesics:  Muscle Relaxants Failed - 12/23/2021  3:45 PM      Failed - This refill cannot be delegated      Passed - Valid encounter within last 6 months    Recent Outpatient Visits           2 weeks ago Primary osteoarthritis of left hip   [facility] 12/25/2021, MD   2 months ago Primary osteoarthritis of left hip   [facility] Jerrol Banana, MD   3 months ago Muscular deconditioning   [facility] Jerrol Banana, MD   4 months ago Primary osteoarthritis of left hip   [facility], MD   5 months ago Mixed hyperlipidemia   [facility], Althea Charon, DO       Future Appointments             In 1 month Netta Neat, Althea Charon, DO [facility], [facility]   In 2 months [facility], Ashley Royalty, MD [facility], [facility]

## 2022-01-28 ENCOUNTER — Other Ambulatory Visit: Payer: Self-pay

## 2022-01-28 ENCOUNTER — Ambulatory Visit (INDEPENDENT_AMBULATORY_CARE_PROVIDER_SITE_OTHER): Payer: Medicare Other | Admitting: Family Medicine

## 2022-01-28 ENCOUNTER — Encounter: Payer: Self-pay | Admitting: Family Medicine

## 2022-01-28 ENCOUNTER — Ambulatory Visit (INDEPENDENT_AMBULATORY_CARE_PROVIDER_SITE_OTHER): Payer: Medicare Other

## 2022-01-28 VITALS — BP 136/80 | HR 93 | Temp 97.3°F | Resp 18 | Ht 69.0 in | Wt 213.0 lb

## 2022-01-28 DIAGNOSIS — M1612 Unilateral primary osteoarthritis, left hip: Secondary | ICD-10-CM | POA: Diagnosis not present

## 2022-01-28 DIAGNOSIS — R1032 Left lower quadrant pain: Secondary | ICD-10-CM

## 2022-01-28 DIAGNOSIS — M159 Polyosteoarthritis, unspecified: Secondary | ICD-10-CM | POA: Diagnosis not present

## 2022-01-28 DIAGNOSIS — Z0000 Encounter for general adult medical examination without abnormal findings: Secondary | ICD-10-CM | POA: Diagnosis not present

## 2022-01-28 LAB — POCT URINALYSIS DIPSTICK
Bilirubin, UA: NEGATIVE
Blood, UA: NEGATIVE
Glucose, UA: NEGATIVE
Ketones, UA: NEGATIVE
Leukocytes, UA: NEGATIVE
Nitrite, UA: NEGATIVE
Protein, UA: NEGATIVE
Spec Grav, UA: 1.015 (ref 1.010–1.025)
Urobilinogen, UA: 0.2 E.U./dL
pH, UA: 5 (ref 5.0–8.0)

## 2022-01-28 NOTE — Progress Notes (Addendum)
HPI:  Patient presents to clinic today for their subsequent annual Medicare wellness exam.  Past Medical History:  Diagnosis Date   Arthritis     Current Outpatient Medications  Medication Sig Dispense Refill   acetaminophen (TYLENOL) 500 MG tablet Take 1,000 mg by mouth every 6 (six) hours as needed for moderate pain.     baclofen (LIORESAL) 10 MG tablet Take by mouth.     diphenhydrAMINE HCl (BENADRYL PO) Take 1 capsule by mouth at bedtime as needed.     DULoxetine (CYMBALTA) 60 MG capsule TAKE 1 CAPSULE(60 MG) BY MOUTH DAILY 90 capsule 0   MAGNESIUM OXIDE PO Take 1 tablet by mouth daily as needed.     vitamin B-12 (CYANOCOBALAMIN) 1000 MCG tablet Take 1,000 mcg by mouth daily.     No current facility-administered medications for this visit.    No Known Allergies  No family history on file.  Social History   Socioeconomic History   Marital status: Widowed    Spouse name: Not on file   Number of children: 4   Years of education: Not on file   Highest education level: Not on file  Occupational History   Occupation: Retired  Tobacco Use   Smoking status: Every Day    Packs/day: 0.50    Types: Cigarettes   Smokeless tobacco: Never  Vaping Use   Vaping Use: Never used  Substance and Sexual Activity   Alcohol use: Yes    Comment: occasionally   Drug use: Never   Sexual activity: Not Currently    Partners: Male  Other Topics Concern   Not on file  Social History Narrative   Widow, 4 children    Social Determinants of Health   Financial Resource Strain: Not on file  Food Insecurity: No Food Insecurity   Worried About Programme researcher, broadcasting/film/video in the Last Year: Never true   Ran Out of Food in the Last Year: Never true  Transportation Needs: No Transportation Needs   Lack of Transportation (Medical): No   Lack of Transportation (Non-Medical): No  Physical Activity: Insufficiently Active   Days of Exercise per Week: 7 days   Minutes of Exercise per Session: 10 min   Stress: Stress Concern Present   Feeling of Stress : To some extent  Social Connections: Socially Isolated   Frequency of Communication with Friends and Family: Three times a week   Frequency of Social Gatherings with Friends and Family: Three times a week   Attends Religious Services: Never   Active Member of Clubs or Organizations: No   Attends Banker Meetings: Never   Marital Status: Widowed  Intimate Partner Violence: Not on file    Hospitiliaztions: No hospitalization in the past 12 mths.  Health Maintenance:    Flu: Declined   Tetanus: 08/19/2016  Pneumovax: Declined     Zostavax: declined   Shingrix: Declined   Covid: Declined   Mammogram:  04/18/2020  Pap Smear:  Declined    Bone Density: declined   Colon Screening: 03/20/2020     Providers:   PCP: Saralyn Pilar, DO  Physical Therapy: Sharman Crate, MD I have personally reviewed and have noted:  1. The patient's medical and social history 2. Their use of alcohol, tobacco or illicit drugs 3. Their current medications and supplements 4. The patient's functional ability including ADL's, fall risks, home safety risks and hearing or visual impairment. 5. Diet and physical activities 6. Evidence for depression or mood disorder  Subjective:  Review of Systems:   Constitutional: Headache.Denies fever, malaise, fatigue or abrupt weight changes.  HEENT: Denies eye pain, eye redness, ear pain, ringing in the ears, wax buildup, runny nose, nasal congestion, bloody nose, or sore throat. Respiratory: Denies difficulty breathing, shortness of breath, cough or sputum production.   Cardiovascular: Denies chest pain, chest tightness, palpitations or swelling in the hands or feet.  Gastrointestinal: Denies abdominal pain, bloating, constipation, diarrhea or blood in the stool.  GU: Intermittent urgency. Denies frequency, pain with urination, burning sensation, blood in urine, odor or  discharge. Musculoskeletal: Intermittent muscle pain. Denies decrease in range of motion, difficulty with gait, joint pain and swelling.  Skin: Denies redness, rashes, lesions or ulcercations.  Neurological: Denies dizziness, difficulty with memory, difficulty with speech or problems with balance and coordination.  Psych: Denies anxiety, depression, SI/HI.  No other specific complaints in a complete review of systems (except as listed in HPI above).  Objective:  PE:   BP 136/80 (BP Location: Left Arm, Patient Position: Sitting, Cuff Size: Normal)    Pulse 93    Temp (!) 97.3 F (36.3 C) (Temporal)    Resp 18    Ht 5\' 9"  (1.753 m)    Wt 213 lb (96.6 kg)    SpO2 98%    BMI 31.45 kg/m  Wt Readings from Last 3 Encounters:  01/28/22 213 lb (96.6 kg)  01/28/22 213 lb (96.6 kg)  12/05/21 208 lb (94.3 kg)     BMET    Component Value Date/Time   NA 141 07/19/2021 0814   K 4.4 07/19/2021 0814   CL 105 07/19/2021 0814   CO2 29 07/19/2021 0814   GLUCOSE 93 07/19/2021 0814   BUN 14 07/19/2021 0814   CREATININE 0.88 07/19/2021 0814   CALCIUM 9.4 07/19/2021 0814    Lipid Panel     Component Value Date/Time   CHOL 260 (H) 07/19/2021 0814   TRIG 367 (H) 07/19/2021 0814   HDL 40 (L) 07/19/2021 0814   CHOLHDL 6.5 (H) 07/19/2021 0814   LDLCALC 158 (H) 07/19/2021 0814    CBC    Component Value Date/Time   WBC 6.2 07/19/2021 0814   RBC 5.03 07/19/2021 0814   HGB 16.2 (H) 07/19/2021 0814   HCT 47.7 (H) 07/19/2021 0814   PLT 236 07/19/2021 0814   MCV 94.8 07/19/2021 0814   MCH 32.2 07/19/2021 0814   MCHC 34.0 07/19/2021 0814   RDW 12.9 07/19/2021 0814   LYMPHSABS 1,600 07/19/2021 0814   EOSABS 341 07/19/2021 0814   BASOSABS 50 07/19/2021 0814    Hgb A1C Lab Results  Component Value Date   HGBA1C 4.8 07/19/2021      Assessment and Plan:   Medicare Annual Wellness Visit:  Diet: Heart healthy  Physical activity: Sedentary Depression/mood screen: Negative,  Flowsheet  Row Office Visit from 01/28/2022 in [place]  PHQ-9 Total Score 4      Hearing: Intact to whispered voice Visual acuity: Grossly normal, performs annual eye exam  ADLs: Capable Fall risk: None Home safety: Good Cognitive evaluation:  6CIT Screen 01/28/2022  What Year? 0 points  What month? 0 points  What time? 0 points  Count back from 20 0 points  Months in reverse 0 points  Repeat phrase 0 points  Total Score 0     EOL planning: Adv directives, full code/ I agree  Nurse Note: Please add modifer   Next appointment: The pt is currently on her providers schedule for  f/u on arthritis.   Lonna Cobb, CMA

## 2022-01-28 NOTE — Progress Notes (Signed)
Subjective:    Patient ID: IONNA SESSOM, female    DOB: [birth year], 66 y.o.   MRN: [id number]  Courtney Cervantes is a 66 y.o. female presenting on 01/28/2022 for Arthritis (Bilateral hip pain, intermittent pain seems to be worst in the left hip pain. ), Abdominal Pain (LLQ intermittent abdominal pain that comes as goes. She also notice some urinary urgency. X 4 days. She denies dysuria or frequency.), and Headache (Pt complains of headache in the back of head x 1 week. She think its allergies related.)  Patient also seen today by Donnie Mesa CMA for AMW  HPI  LLQ Abdominal Pain Today improving symptom now not painful, but she reports had localized "bump" or knot she can feel, past few days. Improving now. Question if diverticulosis vs hernia. Says bowel not interrupted.  Left Hip Pain, Chronic Osteoarthritis multiple joints Bilateral Hips, Low Back Pain  Left hip stiffness persistent but overall pain improved. She improved with injection from orthopedics, and therapy, now they she has come off Diclofenac BID PRN, she has come off of this.  Continues Duloxetine  She has episodic flares with hip pain, worse with pain and stiffness, worse after prolonged sitting   Limited results on Baclofen  Tried Meloxicam without success. Improved temporarily on Diclofenac but has come off now. See Above. Tried intermittent Tylenol, Aleve, Motrin.   Reviewed last X-rays done recently 07/2021   Improved now with PT through Sports [facility] Management    Depression screen [facility] 2/9 01/28/2022 12/05/2021 10/22/2021  Decreased Interest 0 0 0  Down, Depressed, Hopeless 0 0 0  PHQ - 2 Score 0 0 0  Altered sleeping 2 1 1   Tired, decreased energy 2 1 1   Change in appetite 0 0 1  Feeling bad or failure about yourself  0 0 1  Trouble concentrating 0 0 0  Moving slowly or fidgety/restless 0 0 0  Suicidal thoughts 0 0 0  PHQ-9 Score 4 2 4   Difficult doing work/chores Not difficult at all Not  difficult at all Not difficult at all    Social History   Tobacco Use   Smoking status: Every Day    Packs/day: 0.50    Types: Cigarettes   Smokeless tobacco: Never  Vaping Use   Vaping Use: Never used  Substance Use Topics   Alcohol use: Yes    Comment: occasionally   Drug use: Never    Review of Systems Per HPI unless specifically indicated above     Objective:    BP 136/80 (BP Location: Left Arm, Patient Position: Sitting, Cuff Size: Normal)    Pulse 93    Temp (!) 97.3 F (36.3 C) (Temporal)    Resp 18    Ht 5\' 9"  (1.753 m)    Wt 213 lb (96.6 kg)    SpO2 98%    BMI 31.45 kg/m   Wt Readings from Last 3 Encounters:  01/28/22 213 lb (96.6 kg)  01/28/22 213 lb (96.6 kg)  12/05/21 208 lb (94.3 kg)    Physical Exam Vitals and nursing note reviewed.  Constitutional:      General: She is not in acute distress.    Appearance: Normal appearance. She is well-developed. She is not diaphoretic.     Comments: Well-appearing, comfortable, cooperative  HENT:     Head: Normocephalic and atraumatic.  Eyes:     General:        Right eye: No discharge.  Left eye: No discharge.     Conjunctiva/sclera: Conjunctivae normal.  Cardiovascular:     Rate and Rhythm: Normal rate.  Pulmonary:     Effort: Pulmonary effort is normal.  Abdominal:     General: Bowel sounds are normal.     Tenderness: There is no abdominal tenderness. There is no guarding or rebound.  Skin:    General: Skin is warm and dry.     Findings: No erythema or rash.  Neurological:     Mental Status: She is alert and oriented to person, place, and time.  Psychiatric:        Mood and Affect: Mood normal.        Behavior: Behavior normal.        Thought Content: Thought content normal.     Comments: Well groomed, good eye contact, normal speech and thoughts   Results for orders placed or performed in visit on 01/28/22  POCT Urinalysis Dipstick  Result Value Ref Range   Color, UA yellow    Clarity, UA  clear    Glucose, UA Negative Negative   Bilirubin, UA negative    Ketones, UA negative    Spec Grav, UA 1.015 1.010 - 1.025   Blood, UA negative    pH, UA 5.0 5.0 - 8.0   Protein, UA Negative Negative   Urobilinogen, UA 0.2 0.2 or 1.0 E.U./dL   Nitrite, UA negative    Leukocytes, UA Negative Negative   Appearance     Odor        Assessment & Plan:   Problem List Items Addressed This Visit     Primary osteoarthritis of left hip   Relevant Medications   baclofen (LIORESAL) 10 MG tablet   Primary osteoarthritis involving multiple joints   Relevant Medications   baclofen (LIORESAL) 10 MG tablet   Other Visit Diagnoses     Left lower quadrant abdominal pain    -  Primary   Relevant Orders   POCT Urinalysis Dipstick (Completed)       LLQ Abdominal pain Resolving Seems positional No appreciation of hernia Possible lipoma in region of concern but no other obvious issues identified. 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     Follow up plan: Return in about 6 months (around 07/28/2022), or if symptoms worsen or fail to improve, for 6 month Yearly Medicare Checkup (fasting lab AFTER).  Nobie Putnam, [facility] 01/28/2022, 9:17 AM

## 2022-01-28 NOTE — Patient Instructions (Addendum)
Thank you for coming to the office today.  May consider Gabapentin in future if needed.  Keep up the great work  Keep on Duloxetine daily as it is helping manage your pain!  DUE for FASTING BLOOD WORK (no food or drink after midnight before the lab appointment, only water or coffee without cream/sugar on the morning of)  SCHEDULE "Lab Only" visit in the morning at the clinic for lab draw in 6 MONTHS   - Make sure Lab Only appointment is at about 1 week before your next appointment, so that results will be available  For Lab Results, once available within 2-3 days of blood draw, you can can log in to MyChart online to view your results and a brief explanation. Also, we can discuss results at next follow-up visit.    Please schedule a Follow-up Appointment to: Return in about 6 months (around 07/28/2022), or if symptoms worsen or fail to improve, for 6 month Yearly Medicare Checkup (fasting lab AFTER).  If you have any other questions or concerns, please feel free to call the office or send a message through MyChart. You may also schedule an earlier appointment if necessary.  Additionally, you may be receiving a survey about your experience at our office within a few days to 1 week by e-mail or mail. We value your feedback.  Saralyn Pilar, DO [facility], [place]

## 2022-03-05 ENCOUNTER — Ambulatory Visit (INDEPENDENT_AMBULATORY_CARE_PROVIDER_SITE_OTHER): Payer: Medicare Other | Admitting: Family Medicine

## 2022-03-05 ENCOUNTER — Inpatient Hospital Stay (INDEPENDENT_AMBULATORY_CARE_PROVIDER_SITE_OTHER): Payer: Medicare Other | Admitting: Radiology

## 2022-03-05 ENCOUNTER — Encounter: Payer: Self-pay | Admitting: Family Medicine

## 2022-03-05 VITALS — BP 132/82 | HR 104 | Ht 69.0 in | Wt 212.0 lb

## 2022-03-05 DIAGNOSIS — M1612 Unilateral primary osteoarthritis, left hip: Secondary | ICD-10-CM | POA: Diagnosis not present

## 2022-03-05 MED ORDER — CELECOXIB 100 MG PO CAPS: 100.0000 mg | ORAL_CAPSULE | Freq: Two times a day (BID) | ORAL | 1 refills | Status: DC | PRN

## 2022-03-05 MED ORDER — TRIAMCINOLONE ACETONIDE 40 MG/ML IJ SUSP
40.0000 mg | Freq: Once | INTRAMUSCULAR | Status: AC
  Administered 2022-03-05: 40 mg via INTRAMUSCULAR

## 2022-03-05 NOTE — Assessment & Plan Note (Signed)
Chronic issue demonstrating exacerbation. She had been compliant with home exercises from PT but has not had time as of late to continue with these and has noted symptoms recur to the anterior hip. Exam shows positive FADIR, minimal tenderness at the greater trochanter. We reviewed options and she elected to undergo ultrasound-guided left hip injection with cortisone. I did write for PRN Celebrex 100 mg BID to take until symptoms respond to cortisone. I have also advised restart of home exercises once hip symptoms allow. ? ?Chronic condition, exacerbation, Rx management. ?

## 2022-03-05 NOTE — Progress Notes (Signed)
?  ? ?  Primary Care / Sports Medicine Office Visit ? ?Patient Information:  ?Patient ID: Courtney Cervantes, female DOB: 08/09/1956 Age: 66 y.o. MRN: [id number]  ? ?Courtney Cervantes is a pleasant 66 y.o. female presenting with the following: ? ?Chief Complaint  ?Patient presents with  ? Osteoarthritis left hip  ?  Sore, is feeling worst today. Rest and medication seem to help it. PT was helping, doesn't want to go back.   ? ? ?Vitals:  ? 03/05/22 0930  ?BP: 132/82  ?Pulse: (!) 104  ?SpO2: 98%  ? ?Vitals:  ? 03/05/22 0930  ?Weight: 212 lb (96.2 kg)  ?Height: 5\' 9"  (1.753 m)  ? ?Body mass index is 31.31 kg/m?. ? ?No results found.  ? ?Independent interpretation of notes and tests performed by another provider:  ? ?None ? ?Procedures performed:  ? ?Procedure:  Injection of left hip joint under ultrasound guidance. ?Ultrasound guidance utilized for in-plane injection to the left hip intra-articularly, no joint effusion noted, cortical irregularity consistent with degenerative changes. ?Samsung HS60 device utilized with permanent recording / reporting. ?Verbal informed consent obtained and verified. ?Skin prepped in a sterile fashion. ?Ethyl chloride for topical local analgesia.  ?Completed without difficulty and tolerated well. ?Medication: triamcinolone acetonide 40 mg/mL suspension for injection 1 mL total and 2 mL lidocaine 1% without epinephrine utilized for needle placement anesthetic ?Advised to contact for fevers/chills, erythema, induration, drainage, or persistent bleeding. ? ? ?Pertinent History, Exam, Impression, and Recommendations:  ? ?Primary osteoarthritis of left hip ?Chronic issue demonstrating exacerbation. She had been compliant with home exercises from PT but has not had time as of late to continue with these and has noted symptoms recur to the anterior hip. Exam shows positive FADIR, minimal tenderness at the greater trochanter. We reviewed options and she elected to undergo ultrasound-guided  left hip injection with cortisone. I did write for PRN Celebrex 100 mg BID to take until symptoms respond to cortisone. I have also advised restart of home exercises once hip symptoms allow. ? ?Chronic condition, exacerbation, Rx management.  ? ?Orders & Medications ?Meds ordered this encounter  ?Medications  ? celecoxib (CELEBREX) 100 MG capsule  ?  Sig: Take 1 capsule (100 mg total) by mouth 2 (two) times daily as needed.  ?  Dispense:  60 capsule  ?  Refill:  1  ? triamcinolone acetonide (KENALOG-40) injection 40 mg  ? ?Orders Placed This Encounter  ?Procedures  ? [place] LIMITED JOINT SPACE STRUCTURES LOW LEFT  ?  ? ?Return if symptoms worsen or fail to improve.  ?  ? ?Montel Culver, MD ? ? Primary Care Sports Medicine ?[facility] ?[place]  ? ?

## 2022-03-05 NOTE — Patient Instructions (Addendum)
You have just been given a cortisone injection to reduce pain and inflammation. After the injection you may notice immediate relief of pain as a result of the Lidocaine. It is important to rest the area of the injection for 24 to 48 hours after the injection. There is a possibility of some temporary increased discomfort and swelling for up to 72 hours until the cortisone begins to work. If you do have pain, simply rest the joint and use ice. If you can tolerate over the counter medications, you can try Tylenol, Aleve, or Advil for added relief per package instructions. ? ?-Can dose Celebrex twice daily on as-needed basis (take with food) ?-Restart home exercises with information provided through physical therapy ?-Contact for questions and follow-up as needed ? ?

## 2022-04-04 ENCOUNTER — Other Ambulatory Visit: Payer: Self-pay | Admitting: Family Medicine

## 2022-04-04 ENCOUNTER — Telehealth: Payer: Self-pay | Admitting: Family Medicine

## 2022-04-04 DIAGNOSIS — M47817 Spondylosis without myelopathy or radiculopathy, lumbosacral region: Secondary | ICD-10-CM

## 2022-04-04 DIAGNOSIS — R29898 Other symptoms and signs involving the musculoskeletal system: Secondary | ICD-10-CM

## 2022-04-04 DIAGNOSIS — M1612 Unilateral primary osteoarthritis, left hip: Secondary | ICD-10-CM

## 2022-04-04 MED ORDER — DULOXETINE HCL 60 MG PO CPEP: ORAL_CAPSULE | ORAL | 0 refills | Status: DC

## 2022-04-04 NOTE — Telephone Encounter (Signed)
Medication Refill - Medication: DULoxetine (CYMBALTA) 60 MG capsule ? ?Has the patient contacted their pharmacy? Yes.   Pt told to contact provider ? ?Preferred Pharmacy (with phone number or street name):  ?[id number] - [place], [place] - [address] AT [facility] OF [address] [facility] Phone:  [phone]  ?Fax:  [phone]  ?  ? ?Has the patient been seen for an appointment in the last year OR does the patient have an upcoming appointment? Yes.   ? ?Agent: Please be advised that RX refills may take up to 3 business days. We ask that you follow-up with your pharmacy. ? ?

## 2022-04-04 NOTE — Telephone Encounter (Signed)
Requested Prescriptions  ?Pending Prescriptions Disp Refills  ?? DULoxetine (CYMBALTA) 60 MG capsule 90 capsule 0  ?  ? Psychiatry: Antidepressants - SNRI - duloxetine Passed - 04/04/2022  3:25 PM  ?  ?  Passed - Cr in normal range and within 360 days  ?  Creat  ?Date Value Ref Range Status  ?07/19/2021 0.88 0.50 - 1.05 mg/dL Final  ?   ?  ?  Passed - eGFR is 30 or above and within 360 days  ?  eGFR  ?Date Value Ref Range Status  ?07/19/2021 72 > OR = 60 mL/min/1.52m Final  ?  Comment:  ?  The eGFR is based on the CKD-EPI 2021 equation. To calculate  ?the new eGFR from a previous Creatinine or Cystatin C ?result, go to https://www.kidney.org/professionals/ ?kdoqi/gfr%5Fcalculator ?  ?   ?  ?  Passed - Completed PHQ-2 or PHQ-9 in the last 360 days  ?  ?  Passed - Last BP in normal range  ?  BP Readings from Last 1 Encounters:  ?03/05/22 132/82  ?   ?  ?  Passed - Valid encounter within last 6 months  ?  Recent Outpatient Visits   ?      ? 1 month [facility]  ? 2 months ago Left lower quadrant abdominal pain  ? [place]  ? 4 months ago [facility] left [facility]  [facility]  ? 5 months ago [facility] of left hip  [facility]  ? 6 months [facility]  [facility]  ?  ?  ?Future Appointments   ?        ? In 3 months [facility] [facility]ought[facility] ?  ? ?  ?  ?  ? ?

## 2022-04-30 ENCOUNTER — Other Ambulatory Visit: Payer: Self-pay | Admitting: Family Medicine

## 2022-04-30 DIAGNOSIS — M1612 Unilateral primary osteoarthritis, left hip: Secondary | ICD-10-CM

## 2022-05-01 NOTE — Telephone Encounter (Signed)
Requested Prescriptions  Pending Prescriptions Disp Refills  . celecoxib (CELEBREX) 100 MG capsule [Pharmacy Med Name: CELECOXIB 100MG CAPSULES] 60 capsule 1    Sig: TAKE 1 CAPSULE(100 MG) BY MOUTH TWICE DAILY AS NEEDED     Analgesics:  COX2 Inhibitors Failed - 04/30/2022  3:14 AM      Failed - Manual Review: Labs are only required if the patient has taken medication for more than 8 weeks.      Failed - HGB in normal range and within 360 days    Hemoglobin  Date Value Ref Range Status  07/19/2021 16.2 (H) 11.7 - 15.5 g/dL Final         Failed - HCT in normal range and within 360 days    HCT  Date Value Ref Range Status  07/19/2021 47.7 (H) 35.0 - 45.0 % Final         Failed - ALT in normal range and within 360 days    ALT  Date Value Ref Range Status  07/19/2021 34 (H) 6 - 29 U/L Final         Passed - Cr in normal range and within 360 days    Creat  Date Value Ref Range Status  07/19/2021 0.88 0.50 - 1.05 mg/dL Final         Passed - AST in normal range and within 360 days    AST  Date Value Ref Range Status  07/19/2021 27 10 - 35 U/L Final         Passed - eGFR is 30 or above and within 360 days    eGFR  Date Value Ref Range Status  07/19/2021 72 > OR = 60 mL/min/1.32m Final    Comment:    The eGFR is based on the CKD-EPI 2021 equation. To calculate  the new eGFR from a previous Creatinine or Cystatin C result, go to https://www.kidney.org/professionals/ kdoqi/gfr%5Fcalculator          Passed - Patient is not pregnant      Passed - Valid encounter within last 12 months    Recent Outpatient Visits          1 month ago [facility] osteoarthritis of left hip   [facility]   3 months ago Left lower quadrant abdominal pain   [place]   4 months ago [facility] of left [facility]   6 months ago Primary osteoarthritis of left hip    [facility]   7 months ago Muscular deconditioning   [facility]      Future Appointments            In 2 months KPar[facility] Ranger ADevonne Doughty [facility]

## 2022-07-02 ENCOUNTER — Other Ambulatory Visit: Payer: Self-pay | Admitting: Family Medicine

## 2022-07-02 DIAGNOSIS — M47817 Spondylosis without myelopathy or radiculopathy, lumbosacral region: Secondary | ICD-10-CM

## 2022-07-02 DIAGNOSIS — R29898 Other symptoms and signs involving the musculoskeletal system: Secondary | ICD-10-CM

## 2022-07-02 DIAGNOSIS — M1612 Unilateral primary osteoarthritis, left hip: Secondary | ICD-10-CM

## 2022-07-03 NOTE — Telephone Encounter (Signed)
Requested medication (s) are due for refill today: yes  Requested medication (s) are on the active medication list: yes  Last refill:  04/04/22 #90  Future visit scheduled: yes  Notes to clinic:  last prescribed by Dr Rosette Reveal at [facility]   Requested Prescriptions  Pending Prescriptions Disp Refills   DULoxetine (CYMBALTA) 60 MG capsule [Pharmacy Med Name: DULOXETINE DR 60MG CAPSULES] 90 capsule 0    Sig: TAKE 1 CAPSULE(60 MG) BY MOUTH DAILY     Psychiatry: Antidepressants - SNRI - duloxetine Passed - 07/02/2022  3:15 AM      Passed - Cr in normal range and within 360 days    Creat  Date Value Ref Range Status  07/19/2021 0.88 0.50 - 1.05 mg/dL Final         Passed - eGFR is 30 or above and within 360 days    eGFR  Date Value Ref Range Status  07/19/2021 72 > OR = 60 mL/min/1.7m Final    Comment:    The eGFR is based on the CKD-EPI 2021 equation. To calculate  the new eGFR from a previous Creatinine or Cystatin C result, go to https://www.kidney.org/professionals/ kdoqi/gfr%5Fcalculator          Passed - Completed PHQ-2 or PHQ-9 in the last 360 days      Passed - Last BP in normal range    BP Readings from Last 1 Encounters:  03/05/22 132/82         Passed - Valid encounter within last 6 months    Recent Outpatient Visits           4 months ago Primary osteoarthritis of left hip   [facility]   5 months ago Left lower quadrant abdominal pain   [place]   7 months ago [facility] oste[facility] of left hip   [facility]   8 months ago [facility] of left hip   [facility]   9 months ago Muscular deconditioning   [facility]       Future Appointments             In 3 weeks KPar[facility] Ranger ADevonne Doughty [facility]

## 2022-07-29 ENCOUNTER — Ambulatory Visit: Payer: Medicare Other | Admitting: Family Medicine

## 2022-08-30 ENCOUNTER — Telehealth: Payer: Self-pay

## 2022-08-30 DIAGNOSIS — M1612 Unilateral primary osteoarthritis, left hip: Secondary | ICD-10-CM

## 2022-08-30 NOTE — Telephone Encounter (Signed)
Please notify her that referral has been sent to [place], [facility] 08/30/2022, 11:17 AM

## 2022-08-30 NOTE — Telephone Encounter (Signed)
Copied from [place] [phone]. Topic: Referral - Request for Referral >> Aug 29, 2022  3:50 PM Everette C wrote: Has patient seen PCP for this complaint? Yes.   *If NO, is insurance requiring patient see PCP for this issue before PCP can refer them? Referral for which specialty: Orthopedic Specialist  Preferred provider/office: patient has no preference  Reason for referral: left hip discomfort

## 2022-09-09 ENCOUNTER — Encounter: Payer: Self-pay | Admitting: Family Medicine

## 2022-09-09 ENCOUNTER — Ambulatory Visit (INDEPENDENT_AMBULATORY_CARE_PROVIDER_SITE_OTHER): Payer: Medicare Other | Admitting: Family Medicine

## 2022-09-09 VITALS — BP 156/100 | HR 98 | Ht 69.0 in | Wt 222.0 lb

## 2022-09-09 DIAGNOSIS — M1612 Unilateral primary osteoarthritis, left hip: Secondary | ICD-10-CM | POA: Diagnosis not present

## 2022-09-09 NOTE — Progress Notes (Unsigned)
Subjective:    Patient ID: Courtney Cervantes, female    DOB: September 09, 1956, 66 y.o.   MRN: [id number]  Courtney Cervantes is a 66 y.o. female presenting on 09/09/2022 for Hip Pain   HPI  Left Hip Pain, Chronic Osteoarthritis multiple joints Bilateral Hips, Low Back Pain  Interval updates last seen by Dr Rosette Reveal 03/05/22, has done physical therapy and conservative management but still insufficient response Difficulty with function and pain on ambulation She has difficulty with weight bearing and obesity impacting her hip pain, limiting her from exercising to lose weight  REquesting referral to Orthopedics today   Left hip stiffness persistent but overall pain improved. She improved with injection from orthopedics, and therapy, now they she has come off Diclofenac BID PRN, she has come off of this.   Continues Duloxetine   She has episodic flares with hip pain, worse with pain and stiffness, worse after prolonged sitting   Limited results on Baclofen  Tried Meloxicam without success. Improved temporarily on Diclofenac but has come off now. See Above. Tried intermittent Tylenol, Aleve, Motrin.   Reviewed last X-rays done 07/2021    Left Thigh Vein / Pain  No redness, swelling       03/05/2022    9:34 AM 01/28/2022    9:08 AM 12/05/2021    9:20 AM  Depression screen PHQ 2/9  Decreased Interest 0 0 0  Down, Depressed, Hopeless 0 0 0  PHQ - 2 Score 0 0 0  Altered sleeping 1 2 1   Tired, decreased energy 1 2 1   Change in appetite 1 0 0  Feeling bad or failure about yourself  0 0 0  Trouble concentrating 0 0 0  Moving slowly or fidgety/restless 0 0 0  Suicidal thoughts 0 0 0  PHQ-9 Score 3 4 2   Difficult doing work/chores Not difficult at all Not difficult at all Not difficult at all    Social History   Tobacco Use   Smoking status: Every Day    Packs/day: 0.50    Types: Cigarettes   Smokeless tobacco: Never  Vaping Use   Vaping Use: Never used  Substance Use  Topics   Alcohol use: Yes    Comment: occasionally   Drug use: Never    Review of Systems Per HPI unless specifically indicated above     Objective:    BP (!) 156/100   Pulse 98   Ht 5\' 9"  (1.753 m)   Wt 222 lb (100.7 kg)   SpO2 97%   BMI 32.78 kg/m   Wt Readings from Last 3 Encounters:  09/09/22 222 lb (100.7 kg)  03/05/22 212 lb (96.2 kg)  01/28/22 213 lb (96.6 kg)    Physical Exam Vitals and nursing note reviewed.  Constitutional:      General: She is not in acute distress.    Appearance: Normal appearance. She is well-developed. She is not diaphoretic.     Comments: Well-appearing, comfortable, cooperative  HENT:     Head: Normocephalic and atraumatic.  Eyes:     General:        Right eye: No discharge.        Left eye: No discharge.     Conjunctiva/sclera: Conjunctivae normal.  Cardiovascular:     Rate and Rhythm: Normal rate.  Pulmonary:     Effort: Pulmonary effort is normal.  Musculoskeletal:        General: No swelling.     Right lower leg: No edema.  Left lower leg: No edema.     Comments: Left thigh lower superior lateral to L knee, with some localized tenderness without deformity. NO calf symptoms or tenderness or swelling or asymmetry.  Low Back / L hip and Greater Trochanter Inspection: BACK - Normal appearance, no spinal deformity, symmetrical. HIP - Normal appearance, symmetrical, no obvious leg length or pelvis deformity  Palpation: BACK - No tenderness over spinous processes. Bilateral lumbar paraspinal muscles non-tender and without hypertonicity/spasm. Some mild discomfort over R iliac crest region of hip. HIP - Mild tender to palpation deeper L greater trochanter region of lateral upper thigh. Lower extremity thigh calf soft non tender no spasm.  ROM: BACK - Full active ROM forward flex / back extension, rotation L/R without discomfort HIP - LIMITED L hip range of motion with extension, has intact flexion from seated or supine. Reduced  ROM internal and ext rot  Special Testing: BACK - Seated SLR negative for radicular pain bilaterally HIP - tolerates FABER FADIR but has reduced ROM and stiffness.  Strength: Bilateral hip flex/ext 5/5, knee flex/ext 5/5, ankle dorsiflex/plantarflex 5/5 Neurovascular: intact distal sensation to light touch   Skin:    General: Skin is warm and dry.     Findings: No erythema or rash.  Neurological:     Mental Status: She is alert and oriented to person, place, and time.  Psychiatric:        Mood and Affect: Mood normal.        Behavior: Behavior normal.        Thought Content: Thought content normal.     Comments: Well groomed, good eye contact, normal speech and thoughts     I have personally reviewed the radiology report from X-ray L Hip 07/02/21.  CLINICAL DATA:  Chronic low back and hip pain   EXAM: DG HIP (WITH OR WITHOUT PELVIS) 2-3V LEFT; LUMBAR SPINE - COMPLETE 4+ VIEW   COMPARISON:  X-ray lumbar spine dated November 01, 2006   FINDINGS: Lumbar spine: Five non-rib-bearing lumbar type vertebral bodies. Vertebral body heights are well maintained. No evidence of acute fracture. Normal alignment. Multilevel degenerative disc disease consisting of mild osteophyte formation and preserved disc space heights. Mild facet arthrosis, most pronounced in the lower lumbar spine. Soft tissues are unremarkable.   Left hip: No acute fracture or dislocation. Moderate degenerative changes of the left hip consisting of osteophyte formation, joint space loss, and subchondral sclerosis. Bilateral SI joints and pubic symphysis are unremarkable. Visualized sacrum is intact. Single AP view of the right hip demonstrates moderate degenerative changes and no acute osseous abnormality.   IMPRESSION: Mild spondylosis of the lumbar spine, increased compared to 2007 prior.   Moderate degenerative changes of the left hip.     Electronically Signed   By: Yetta Glassman MD   On: 07/03/2021  09:13    Results for orders placed or performed in visit on 01/28/22  POCT Urinalysis Dipstick  Result Value Ref Range   Color, UA yellow    Clarity, UA clear    Glucose, UA Negative Negative   Bilirubin, UA negative    Ketones, UA negative    Spec Grav, UA 1.015 1.010 - 1.025   Blood, UA negative    pH, UA 5.0 5.0 - 8.0   Protein, UA Negative Negative   Urobilinogen, UA 0.2 0.2 or 1.0 E.U./dL   Nitrite, UA negative    Leukocytes, UA Negative Negative   Appearance     Odor  Assessment & Plan:   Problem List Items Addressed This Visit     Primary osteoarthritis of left hip - Primary   Relevant Orders   Ambulatory referral to Orthopedic Surgery    Progressive / advanced OA/DJD Left Hip Functional limitations pain and limited mobility Not improved on PT or other conservative measures Has seen [facility] Referral to [facility] Dr Marry Guan as requested  Left lower thigh may be pain related to the hip, if compensating with walking due to pain from the left hip. At this point, it does not meet criteria for blood clot  Okay to take Tylenol  Use RICE therapy: - R - Rest / relative rest with activity modification avoid overuse of joint - I - Ice packs (make sure you use a towel or sock / something to protect skin) - C - Compression with flexible Knee Sleeve / ACE wrap to apply pressure and reduce swelling allowing more support - E - Elevation - if significant swelling, lift leg above heart level (toes above your nose) to help reduce swelling, most helpful at night after day of being on your feet  If significant worsening, leg swelling, redness worsening pain overall - could consider imaging more urgently.  Orders Placed This Encounter  Procedures   Ambulatory referral to Orthopedic Surgery    Referral Priority:   Routine    Referral Type:   Surgical    Referral Reason:   Specialty Services Required    Referred to Provider:   Dereck Leep, MD    Requested  Specialty:   Orthopedic Surgery    Number of Visits Requested:   1     No orders of the defined types were placed in this encounter.     Follow up plan: Return if symptoms worsen or fail to improve.    Nobie Putnam, [facility] 09/09/2022, 1:55 PM

## 2022-09-09 NOTE — Patient Instructions (Addendum)
Thank you for coming to the office today.  [facility] [place], [place]  [address] Phone: [phone]  To Dr Juanito Doom - stay tuned for apt they will call you to schedule. If not heard back within 2 weeks at all, you can call them to schedule.  Left lower thigh may be pain related to the hip, if compensating with walking due to pain from the left hip.  At this point, it does not meet criteria for blood clot  Okay to take Tylenol  Use RICE therapy: - R - Rest / relative rest with activity modification avoid overuse of joint - I - Ice packs (make sure you use a towel or sock / something to protect skin) - C - Compression with flexible Knee Sleeve / ACE wrap to apply pressure and reduce swelling allowing more support - E - Elevation - if significant swelling, lift leg above heart level (toes above your nose) to help reduce swelling, most helpful at night after day of being on your feet  If significant worsening, leg swelling, redness worsening pain overall - could consider imaging more urgently.  Please schedule a Follow-up Appointment to: Return if symptoms worsen or fail to improve.  If you have any other questions or concerns, please feel free to call the office or send a message through [place]. You may also schedule an earlier appointment if necessary.  Additionally, you may be receiving a survey about your experience at our office within a few days to 1 week by e-mail or mail. We value your feedback.  Nobie Putnam, DO [place]

## 2022-12-02 NOTE — Discharge Instructions (Signed)
Instructions after Total Hip Replacement     Delvis Kau P. Cambree Hendrix, Jr., M.D.     Dept. of Orthopaedics & Sports Medicine  [facility]  [address]  [place], [place]  [address]  Phone: [phone]   Fax: [phone]    DIET: . Drink plenty of non-alcoholic fluids. . Resume your normal diet. Include foods high in fiber.  ACTIVITY:  . You may use crutches or a walker with weight-bearing as tolerated, unless instructed otherwise. . You may be weaned off of the walker or crutches by your Physical Therapist.  . Do NOT reach below the level of your knees or cross your legs until allowed.    . Continue doing gentle exercises. Exercising will reduce the pain and swelling, increase motion, and prevent muscle weakness.   . Please continue to use the TED compression stockings for 6 weeks. You may remove the stockings at night, but should reapply them in the morning. . Do not drive or operate any equipment until instructed.  WOUND CARE:  . Continue to use ice packs periodically to reduce pain and swelling. . Keep the incision clean and dry. . You may bathe or shower after the staples are removed at the first office visit following surgery.  MEDICATIONS: . You may resume your regular medications. . Please take the pain medication as prescribed on the medication. . Do not take pain medication on an empty stomach. . You have been given a prescription for a blood thinner to prevent blood clots. Please take the medication as instructed. (NOTE: After completing a 2 week course of Lovenox, take one Enteric-coated aspirin once a day.) . Pain medications and iron supplements can cause constipation. Use a stool softener (Senokot or Colace) on a daily basis and a laxative (dulcolax or miralax) as needed. . Do not drive or drink alcoholic beverages when taking pain medications.  CALL THE OFFICE FOR: . Temperature above 101 degrees . Excessive bleeding or drainage on the dressing. . Excessive  swelling, coldness, or paleness of the toes. . Persistent nausea and vomiting.  FOLLOW-UP:  . You should have an appointment to return to the office in 6 weeks after surgery. . Arrangements have been made for continuation of Physical Therapy (either home therapy or outpatient therapy).     [facility] Department Directory         [email or web address]       [email or web address]          Cardiology  Appointments: [place] - [phone] [place] - [phone]  Endocrinology  Appointments: [place] - [phone] [place] - [phone]  Gastroenterology  Appointments: [place] - [phone] [place] - [phone]        General Surgery   Appointments: [place] - [phone]  Internal Medicine/Family Medicine  Appointments: [place] - [phone] [place] - [phone] [place] - [phone]  Metabolic and Weigh Loss Surgery  Appointments: [place] - [phone]        Neurology  Appointments: [place] - [phone] [place] - [phone]  Neurosurgery  Appointments: [place] - [phone]  Obstetrics & Gynecology  Appointments: [place] - [phone] [place] - [phone]        Pediatrics  Appointments: [place] - [phone] [place] - [phone]  Physiatry  Appointments: [place] -[phone]  Physical Therapy  Appointments: [place] - [phone] [place] - [phone]        Podiatry  Appointments: [place] - [phone] [place] - [phone]  Pulmonology  Appointments: [place] - [phone]  Rheumatology  Appointments: [place] - [phone]        [place] Location: [facility]  [address] [place], [place]  [address]  [place] Location: [facility] [address] [place], [place]  [address]  [place] Location: [facility] [address] [place], [place]  [address]    

## 2022-12-10 ENCOUNTER — Encounter
Admission: RE | Admit: 2022-12-10 | Discharge: 2022-12-10 | Disposition: A | Discharge status: Home / Self Care | Payer: Medicare HMO | Source: Ambulatory Visit | Attending: Orthopedic Surgery | Admitting: Orthopedic Surgery

## 2022-12-10 VITALS — BP 135/84 | HR 92 | Temp 97.6°F | Resp 18 | Ht 69.0 in | Wt 220.9 lb

## 2022-12-10 DIAGNOSIS — R29898 Other symptoms and signs involving the musculoskeletal system: Secondary | ICD-10-CM

## 2022-12-10 DIAGNOSIS — E782 Mixed hyperlipidemia: Secondary | ICD-10-CM

## 2022-12-10 DIAGNOSIS — Z01818 Encounter for other preprocedural examination: Secondary | ICD-10-CM | POA: Diagnosis not present

## 2022-12-10 DIAGNOSIS — M1612 Unilateral primary osteoarthritis, left hip: Secondary | ICD-10-CM | POA: Diagnosis not present

## 2022-12-10 DIAGNOSIS — Z01812 Encounter for preprocedural laboratory examination: Secondary | ICD-10-CM

## 2022-12-10 HISTORY — DX: Gastro-esophageal reflux disease without esophagitis: K21.9

## 2022-12-10 LAB — URINALYSIS, ROUTINE W REFLEX MICROSCOPIC
Bilirubin Urine: NEGATIVE
Glucose, UA: NEGATIVE mg/dL
Hgb urine dipstick: NEGATIVE
Ketones, ur: NEGATIVE mg/dL
Nitrite: NEGATIVE
Protein, ur: NEGATIVE mg/dL
Specific Gravity, Urine: 1.009 (ref 1.005–1.030)
pH: 5 (ref 5.0–8.0)

## 2022-12-10 LAB — COMPREHENSIVE METABOLIC PANEL
ALT: 18 U/L (ref 0–44)
AST: 18 U/L (ref 15–41)
Albumin: 4.1 g/dL (ref 3.5–5.0)
Alkaline Phosphatase: 62 U/L (ref 38–126)
Anion gap: 8 (ref 5–15)
BUN: 9 mg/dL (ref 8–23)
CO2: 28 mmol/L (ref 22–32)
Calcium: 9 mg/dL (ref 8.9–10.3)
Chloride: 104 mmol/L (ref 98–111)
Creatinine, Ser: 0.96 mg/dL (ref 0.44–1.00)
GFR, Estimated: >60 mL/min (ref >60)
Glucose, Bld: 93 mg/dL (ref 70–99)
Potassium: 3.9 mmol/L (ref 3.5–5.1)
Sodium: 140 mmol/L (ref 135–145)
Total Bilirubin: 1 mg/dL (ref 0.3–1.2)
Total Protein: 6.8 g/dL (ref 6.5–8.1)

## 2022-12-10 LAB — SURGICAL PCR SCREEN
MRSA, PCR: NEGATIVE
Staphylococcus aureus: NEGATIVE

## 2022-12-10 LAB — CBC WITH DIFFERENTIAL/PLATELET
Abs Immature Granulocytes: 0.02 10*3/uL (ref 0.00–0.07)
Basophils Absolute: 0.1 10*3/uL (ref 0.0–0.1)
Basophils Relative: 1 %
Eosinophils Absolute: 0.2 10*3/uL (ref 0.0–0.5)
Eosinophils Relative: 3 %
HCT: 48.3 % — ABNORMAL HIGH (ref 36.0–46.0)
Hemoglobin: 16 g/dL — ABNORMAL HIGH (ref 12.0–15.0)
Immature Granulocytes: 0 %
Lymphocytes Relative: 22 %
Lymphs Abs: 1.5 10*3/uL (ref 0.7–4.0)
MCH: 31 pg (ref 26.0–34.0)
MCHC: 33.1 g/dL (ref 30.0–36.0)
MCV: 93.6 fL (ref 80.0–100.0)
Monocytes Absolute: 0.3 10*3/uL (ref 0.1–1.0)
Monocytes Relative: 5 %
Neutro Abs: 4.7 10*3/uL (ref 1.7–7.7)
Neutrophils Relative %: 69 %
Platelets: 266 10*3/uL (ref 150–400)
RBC: 5.16 MIL/uL — ABNORMAL HIGH (ref 3.87–5.11)
RDW: 12.3 % (ref 11.5–15.5)
WBC: 6.9 10*3/uL (ref 4.0–10.5)
nRBC: 0 % (ref 0.0–0.2)

## 2022-12-10 LAB — C-REACTIVE PROTEIN: CRP: 0.5 mg/dL (ref <1.0)

## 2022-12-10 LAB — TYPE AND SCREEN
ABO/RH(D): A POS
Antibody Screen: NEGATIVE

## 2022-12-10 LAB — SEDIMENTATION RATE: Sed Rate: 6 mm/hr (ref 0–30)

## 2022-12-10 NOTE — Patient Instructions (Addendum)
Your procedure is scheduled on: Monday December 23, 2022. Report to Day Surgery inside [facility] 2nd floor, stop by registration desk before getting on elevator. To find out your arrival time please call [phone] between 1PM - 3PM on Friday December 20, 2022.  Remember: Instructions that are not followed completely may result in serious medical risk,  up to and including death, or upon the discretion of your surgeon and anesthesiologist your  surgery may need to be rescheduled.     _X__ 1. Do not eat food after midnight the night before your procedure.                 No chewing gum or hard candies. You may drink clear liquids up to 2 hours                 before you are scheduled to arrive for your surgery- DO not drink clear                 liquids within 2 hours of the start of your surgery.                 Clear Liquids include:  water, apple juice without pulp, clear Gatorade, G2 or                  Gatorade Zero (avoid Red/Purple/Blue), Black Coffee or Tea (Do not add                 anything to coffee or tea).  __X__2.   Complete the "Ensure Clear Pre-surgery Clear Carbohydrate Drink" provided to you, 2 hours before arrival. **If you are diabetic you will be provided with an alternative drink, Gatorade Zero or G2.  __X__3.  On the morning of surgery brush your teeth with toothpaste and water, you                may rinse your mouth with mouthwash if you wish.  Do not swallow any toothpaste or mouthwash.     _X__ 4.  No Alcohol for 24 hours before or after surgery.   _X__ 5.  Do Not Smoke or use e-cigarettes For 24 Hours Prior to Your Surgery.                 Do not use any chewable tobacco products for at least 6 hours prior to                 Surgery.  _X__  6.  Do not use any recreational drugs (marijuana, cocaine, heroin, ecstasy, MDMA or other)                For at least one week prior to your surgery.  Combination of these drugs with anesthesia                 May have life threatening results.  ____  7.  Bring all medications with you on the day of surgery if instructed.   __X__ 8.  Notify your doctor if there is any change in your medical condition      (cold, fever, infections).     Do not wear jewelry, make-up, hairpins, clips or nail polish. Do not wear lotions, powders, or perfumes. You may wear deodorant. Do not shave 48 hours prior to surgery. Men may shave face and neck. Do not bring valuables to the hospital.    [facility] is not responsible for any belongings or valuables.  Contacts,  dentures or bridgework may not be worn into surgery. Leave your suitcase in the car. After surgery it may be brought to your room. For patients admitted to the hospital, discharge time is determined by your treatment team.   Patients discharged the day of surgery will not be allowed to drive home.   Make arrangements for someone to be with you for the first 24 hours of your Same Day Discharge.   _X___ Take these medicines the morning of surgery with A SIP OF WATER:    1.famotidine (PEPCID) 20 MG     2.   3.   4.  5.  6.  ____ Fleet Enema (as directed)   __X__ Use CHG Soap (or wipes) as directed  ____ Use Benzoyl Peroxide Gel as instructed  ____ Use inhalers on the day of surgery  ____ Stop metformin 2 days prior to surgery    ____ Take 1/2 of usual insulin dose the night before surgery. No insulin the morning          of surgery.   ____ Call your PCP, cardiologist, or Pulmonologist if taking Coumadin/Plavix/aspirin and ask when to stop before your surgery.   __X__ One Week prior to surgery- Stop Anti-inflammatories such as Ibuprofen, Aleve, Advil, Motrin, meloxicam (MOBIC), diclofenac, etodolac, ketorolac, Toradol, Daypro, piroxicam, Goody's or BC powders. OK TO USE TYLENOL IF NEEDED   __X__ Stop supplements until after surgery.    ____ Bring C-Pap to the hospital.    If you have any questions regarding your  pre-procedure instructions,  Please call Pre-admit Testing at [phone]    Preparing for Surgery with CHLORHEXIDINE GLUCONATE (CHG) Soap  Chlorhexidine Gluconate (CHG) Soap  o An antiseptic cleaner that kills germs and bonds with the skin to continue killing germs even after washing  o Used for showering the night before surgery and morning of surgery  Before surgery, you can play an important role by reducing the number of germs on your skin.  CHG (Chlorhexidine gluconate) soap is an antiseptic cleanser which kills germs and bonds with the skin to continue killing germs even after washing.  Please do not use if you have an allergy to CHG or antibacterial soaps. If your skin becomes reddened/irritated stop using the CHG.  1. Shower the NIGHT BEFORE SURGERY and the MORNING OF SURGERY with CHG soap.  2. If you choose to wash your hair, wash your hair first as usual with your normal shampoo.  3. After shampooing, rinse your hair and body thoroughly to remove the shampoo.  4. Use CHG as you would any other liquid soap. You can apply CHG directly to the skin and wash gently with a scrungie or a clean washcloth.  5. Apply the CHG soap to your body only from the neck down. Do not use on open wounds or open sores. Avoid contact with your eyes, ears, mouth, and genitals (private parts). Wash face and genitals (private parts) with your normal soap.  6. Wash thoroughly, paying special attention to the area where your surgery will be performed.  7. Thoroughly rinse your body with warm water.  8. Do not shower/wash with your normal soap after using and rinsing off the CHG soap.  9. Pat yourself dry with a clean towel.  10. Wear clean pajamas to bed the night before surgery.  12. Place clean sheets on your bed the night of your first shower and do not sleep with pets.  13. Shower again with the CHG soap on the  day of surgery prior to arriving at the hospital.  14. Do not apply any  deodorants/lotions/powders.  15. Please wear clean clothes to the hospital.

## 2022-12-19 NOTE — H&P (Signed)
ORTHOPAEDIC HISTORY & PHYSICAL Regino Bellow, PA - 12/10/2022 9:15 AM EST Formatting of this note is different from the original. Images from the original note were not included. Chief Complaint Chief Complaint Patient presents with Hip Pain H & P LEFT HIP  Reason for Visit Courtney Cervantes is a 67 y.o. who presents today for a history and physical. She is to undergo a left total hip arthroplasty on 12/23/2022. Patient states that there has been no change in her condition since her last visit and wishes to proceed with having surgery to the left hip.  She reports a long history of left hip and groin pain. The pain is worse with weight bearing and is worse after period of inactivity. She reports startup stiffness. The hip pain limits the patient's ability to ambulate long distances. The patient has not appreciated any significant improvement despite tylenol, NSAIDs, intraarticular corticosteroid injections, physical therapy, and activity modification. She is not using any ambulatory aids. The patient states that the hip pain has progressed to the point that it is significantly interfering with her activities of daily living.  Past Medical History Past Medical History: Diagnosis Date History of chicken pox IBS (irritable bowel syndrome) Osteoarthritis Seasonal allergies  Past Surgical History Past Surgical History: Procedure Laterality Date APPENDECTOMY FIBROID TUMOR REMOVAL TUBAL LIGATION  Past Family History Family History Problem Relation Age of Onset Intestinal malrotation Mother Dementia Father Breast cancer Sister No Known Problems Sister No Known Problems Sister No Known Problems Sister No Known Problems Sister No Known Problems Sister No Known Problems Brother No Known Problems Brother No Known Problems Brother No Known Problems Brother  Medications Current Outpatient Medications Ordered in Epic Medication Sig Dispense Refill acetaminophen (TYLENOL) 500  MG tablet Take 1,000 mg by mouth every 6 (six) hours as needed for Pain celecoxib (CELEBREX) 100 MG capsule Take 100 mg by mouth once daily cholecalciferol 1000 unit tablet Take 1 tablet by mouth once daily cyanocobalamin (VITAMIN B12) 1000 MCG tablet Take 1,000 mcg by mouth once daily cyclobenzaprine (FLEXERIL) 10 MG tablet Take 10 mg by mouth at bedtime as needed for Muscle spasms diphenhydrAMINE (BENADRYL) 50 MG tablet Take 50 mg by mouth at bedtime as needed famotidine (PEPCID) 20 MG tablet Take 20 mg by mouth once daily as needed for Heartburn magnesium oxide (MAG-OX) 400 mg (241.3 mg magnesium) tablet Take 1 tablet by mouth once daily as needed zinc acetate 50 mg (zinc) Cap Take 1 caplet by mouth once daily  No current Epic-ordered facility-administered medications on file.  Allergies No Known Allergies  Review of Systems A comprehensive 14 point ROS was performed, reviewed, and the pertinent orthopaedic findings are documented in the HPI.  Exam BP (!) 140/92 (BP Location: Left upper arm, Patient Position: Sitting, BP Cuff Size: Large Adult)  Ht 175.3 cm (5\' 9" )  Wt 99.9 kg (220 lb 3.2 oz)  BMI 32.52 kg/m  General: Well-developed well-nourished female seen in no acute distress.  HEENT: Atraumatic,normocephalic. Pupils are equal and reactive to light. Oropharynx is clear with moist mucosa  Lungs: Clear to auscultation bilaterally  Cardiovascular: Regular rate and rhythm. Normal S1, S2. No murmurs. No appreciable gallops or rubs. Peripheral pulses are palpable.  Abdomen: Soft, non-tender, nondistended. Bowel sounds present  Extremity: Left Hip: Pelvic tilt: Negative Limb lengths: Equal with the patient standing Soft tissue swelling: Negative Erythema: Negative Crepitance: Negative Tenderness: Greater trochanter is nontender to palpation. Moderate pain is elicited by axial compression or extremes of rotation. Atrophy: No atrophy.  Fair to good hip flexor and abductor  strength. Range of Motion: EXT/FLEX: 0/0/110 ADD/ABD: 10/0/20 IR/ER: 20/0/20   Neurological:  The patient is alert and oriented Sensation to light touch appears to be intact and within normal limits Gross motor strength appeared to be equal to 5/5  Vascular :  Peripheral pulses felt to be palpable. Capillary refill appears to be intact and within normal limits  X-ray  3 views of the left hip ordered and interpreted on today's visit shows significant narrowing of the cartilage space centrally. Subchondral porosis was noted along with osteophyte formation. No acute bony abnormalities was noted. Questionable old fracture of the left pelvic ring.  Impression  1. Degenerative arthrosis left hip  Plan  1. I did go over the patient's medication list on today's visit. She is taking no anticoagulation medication 2. Past medical history reviewed 3. Return to clinic postop as scheduled sooner if any problems 4. Did discuss postop rehab course  This note was generated in part with voice recognition software and I apologize for any typographical errors that were not detected and corrected   Watt Climes PA Electronically signed by Regino Bellow, PA at 12/10/2022 9:44 AM EST

## 2022-12-20 NOTE — Anesthesia Preprocedure Evaluation (Addendum)
Anesthesia Evaluation  Patient identified by MRN, date of birth, ID band Patient awake    Reviewed: Allergy & Precautions, H&P , NPO status , Patient's Chart, lab work & pertinent test results  Airway Mallampati: III  TM Distance: >3 FB Neck ROM: full    Dental  (+) Caps, Missing   Pulmonary Current SmokerPatient did not abstain from smoking.   Pulmonary exam normal        Cardiovascular negative cardio ROS Normal cardiovascular exam     Neuro/Psych negative neurological ROS  negative psych ROS   GI/Hepatic Neg liver ROS,GERD  Controlled and Medicated,,  Endo/Other  negative endocrine ROS    Renal/GU      Musculoskeletal  (+) Arthritis ,    Abdominal  (+) + obese  Peds  Hematology negative hematology ROS (+)   Anesthesia Other Findings Past Medical History: No date: Arthritis No date: GERD (gastroesophageal reflux disease)  Past Surgical History: 1980: APPENDECTOMY 2016: EYE SURGERY     Comment:  Cataracts surgery 1994: TUBAL LIGATION; Bilateral 1980: UTERINE FIBROID SURGERY     Reproductive/Obstetrics negative OB ROS                             Anesthesia Physical Anesthesia Plan  ASA: 2  Anesthesia Plan: Spinal   Post-op Pain Management: Celebrex PO (pre-op)*, Regional block* and Gabapentin PO (pre-op)*   Induction: Intravenous  PONV Risk Score and Plan: Propofol infusion  Airway Management Planned: Natural Airway  Additional Equipment:   Intra-op Plan:   Post-operative Plan:   Informed Consent: I have reviewed the patients History and Physical, chart, labs and discussed the procedure including the risks, benefits and alternatives for the proposed anesthesia with the patient or authorized representative who has indicated his/her understanding and acceptance.     Dental Advisory Given  Plan Discussed with: CRNA and Surgeon  Anesthesia Plan Comments:          Anesthesia Quick Evaluation

## 2022-12-22 ENCOUNTER — Encounter: Payer: Self-pay | Admitting: Orthopedic Surgery

## 2022-12-22 MED ORDER — CELECOXIB 200 MG PO CAPS: 400.0000 mg | ORAL_CAPSULE | Freq: Once | ORAL | Status: AC

## 2022-12-22 MED ORDER — CHLORHEXIDINE GLUCONATE 0.12 % MT SOLN: 15.0000 mL | Freq: Once | OROMUCOSAL | Status: AC

## 2022-12-22 MED ORDER — CEFAZOLIN SODIUM-DEXTROSE 2-4 GM/100ML-% IV SOLN
2.0000 g | INTRAVENOUS | Status: AC
  Administered 2022-12-23: 2 g via INTRAVENOUS

## 2022-12-22 MED ORDER — LACTATED RINGERS IV SOLN: INTRAVENOUS | Status: DC

## 2022-12-22 MED ORDER — DEXAMETHASONE SODIUM PHOSPHATE 10 MG/ML IJ SOLN: 8.0000 mg | Freq: Once | INTRAMUSCULAR | Status: AC

## 2022-12-22 MED ORDER — GABAPENTIN 300 MG PO CAPS: 300.0000 mg | ORAL_CAPSULE | Freq: Once | ORAL | Status: AC

## 2022-12-22 MED ORDER — ORAL CARE MOUTH RINSE: 15.0000 mL | Freq: Once | OROMUCOSAL | Status: AC

## 2022-12-22 MED ORDER — CHLORHEXIDINE GLUCONATE 4 % EX LIQD
60.0000 mL | Freq: Once | CUTANEOUS | Status: AC
  Administered 2022-12-23: 4 via TOPICAL

## 2022-12-22 MED ORDER — TRANEXAMIC ACID-NACL 1000-0.7 MG/100ML-% IV SOLN
1000.0000 mg | INTRAVENOUS | Status: AC
  Administered 2022-12-23: 1000 mg via INTRAVENOUS

## 2022-12-23 ENCOUNTER — Encounter: Payer: Self-pay | Admitting: Orthopedic Surgery

## 2022-12-23 ENCOUNTER — Ambulatory Visit: Payer: Medicare HMO | Admitting: Urgent Care

## 2022-12-23 ENCOUNTER — Ambulatory Visit: Payer: Medicare HMO | Admitting: Anesthesiology

## 2022-12-23 ENCOUNTER — Observation Stay
Admission: RE | Admit: 2022-12-23 | Discharge: 2022-12-24 | Disposition: A | Discharge status: Home Health | Payer: Medicare HMO | Attending: Orthopedic Surgery | Admitting: Orthopedic Surgery

## 2022-12-23 ENCOUNTER — Other Ambulatory Visit: Payer: Self-pay

## 2022-12-23 ENCOUNTER — Encounter
Admission: RE | Disposition: A | Discharge status: Home Health | Payer: Self-pay | Source: Home / Self Care | Attending: Orthopedic Surgery

## 2022-12-23 ENCOUNTER — Observation Stay: Payer: Medicare HMO

## 2022-12-23 DIAGNOSIS — E782 Mixed hyperlipidemia: Secondary | ICD-10-CM

## 2022-12-23 DIAGNOSIS — R29898 Other symptoms and signs involving the musculoskeletal system: Secondary | ICD-10-CM

## 2022-12-23 DIAGNOSIS — Z96642 Presence of left artificial hip joint: Secondary | ICD-10-CM

## 2022-12-23 DIAGNOSIS — M1612 Unilateral primary osteoarthritis, left hip: Secondary | ICD-10-CM | POA: Diagnosis not present

## 2022-12-23 HISTORY — PX: TOTAL HIP ARTHROPLASTY: SHX124

## 2022-12-23 LAB — ABO/RH: ABO/RH(D): A POS

## 2022-12-23 SURGERY — ARTHROPLASTY, HIP, TOTAL,POSTERIOR APPROACH
Anesthesia: Spinal | Site: Hip | Laterality: Left

## 2022-12-23 MED ORDER — PHENYLEPHRINE HCL-NACL 20-0.9 MG/250ML-% IV SOLN
INTRAVENOUS | Status: DC | PRN
  Administered 2022-12-23: 80 ug via INTRAVENOUS
  Administered 2022-12-23: 40 ug/min via INTRAVENOUS

## 2022-12-23 MED ORDER — IRRISEPT - 450ML BOTTLE WITH 0.05% CHG IN STERILE WATER, USP 99.95% OPTIME
TOPICAL | Status: DC | PRN
  Administered 2022-12-23: 450 mL

## 2022-12-23 MED ORDER — TRANEXAMIC ACID-NACL 1000-0.7 MG/100ML-% IV SOLN
1000.0000 mg | Freq: Once | INTRAVENOUS | Status: AC
  Administered 2022-12-23: 1000 mg via INTRAVENOUS

## 2022-12-23 MED ORDER — PROMETHAZINE HCL 25 MG/ML IJ SOLN: 6.2500 mg | INTRAMUSCULAR | Status: DC | PRN

## 2022-12-23 MED ORDER — CYCLOBENZAPRINE HCL 10 MG PO TABS
10.0000 mg | ORAL_TABLET | Freq: Three times a day (TID) | ORAL | Status: DC | PRN
  Filled 2022-12-23: qty 1

## 2022-12-23 MED ORDER — TRAMADOL HCL 50 MG PO TABS
50.0000 mg | ORAL_TABLET | ORAL | Status: DC | PRN
  Administered 2022-12-23: 50 mg via ORAL

## 2022-12-23 MED ORDER — PHENYLEPHRINE HCL (PRESSORS) 10 MG/ML IV SOLN
INTRAVENOUS | Status: DC | PRN
  Administered 2022-12-23: 80 ug via INTRAVENOUS

## 2022-12-23 MED ORDER — CELECOXIB 200 MG PO CAPS
ORAL_CAPSULE | ORAL | Status: AC
  Filled 2022-12-23: qty 1

## 2022-12-23 MED ORDER — MAGNESIUM OXIDE -MG SUPPLEMENT 400 (240 MG) MG PO TABS
400.0000 mg | ORAL_TABLET | Freq: Every day | ORAL | Status: DC
  Filled 2022-12-23: qty 1

## 2022-12-23 MED ORDER — ACETAMINOPHEN 10 MG/ML IV SOLN: 1000.0000 mg | Freq: Once | INTRAVENOUS | Status: DC | PRN

## 2022-12-23 MED ORDER — ACETAMINOPHEN 10 MG/ML IV SOLN
INTRAVENOUS | Status: AC
  Filled 2022-12-23: qty 100

## 2022-12-23 MED ORDER — FERROUS SULFATE 325 (65 FE) MG PO TABS: 325.0000 mg | ORAL_TABLET | Freq: Two times a day (BID) | ORAL | Status: DC

## 2022-12-23 MED ORDER — SODIUM CHLORIDE 0.9 % IV SOLN: INTRAVENOUS | Status: DC

## 2022-12-23 MED ORDER — CELECOXIB 200 MG PO CAPS
ORAL_CAPSULE | ORAL | Status: AC
  Administered 2022-12-23: 400 mg via ORAL
  Filled 2022-12-23: qty 2

## 2022-12-23 MED ORDER — OXYCODONE HCL 5 MG PO TABS
ORAL_TABLET | ORAL | Status: AC
  Filled 2022-12-23: qty 1

## 2022-12-23 MED ORDER — FENTANYL CITRATE (PF) 100 MCG/2ML IJ SOLN
INTRAMUSCULAR | Status: AC
  Filled 2022-12-23: qty 2

## 2022-12-23 MED ORDER — BISACODYL 10 MG RE SUPP: 10.0000 mg | Freq: Every day | RECTAL | Status: DC | PRN

## 2022-12-23 MED ORDER — ENSURE PRE-SURGERY PO LIQD
296.0000 mL | Freq: Once | ORAL | Status: AC
  Administered 2022-12-23: 296 mL via ORAL

## 2022-12-23 MED ORDER — PROPOFOL 1000 MG/100ML IV EMUL
INTRAVENOUS | Status: AC
  Filled 2022-12-23: qty 100

## 2022-12-23 MED ORDER — CELECOXIB 200 MG PO CAPS
200.0000 mg | ORAL_CAPSULE | Freq: Two times a day (BID) | ORAL | Status: DC
  Administered 2022-12-23 – 2022-12-24 (×2): 200 mg via ORAL

## 2022-12-23 MED ORDER — FENTANYL CITRATE (PF) 100 MCG/2ML IJ SOLN
25.0000 ug | INTRAMUSCULAR | Status: DC | PRN
  Administered 2022-12-23 (×2): 25 ug via INTRAVENOUS
  Administered 2022-12-23 (×2): 50 ug via INTRAVENOUS

## 2022-12-23 MED ORDER — MENTHOL 3 MG MT LOZG: 1.0000 | LOZENGE | OROMUCOSAL | Status: DC | PRN

## 2022-12-23 MED ORDER — METOPROLOL TARTRATE 5 MG/5ML IV SOLN
INTRAVENOUS | Status: DC | PRN
  Administered 2022-12-23: 2 mg via INTRAVENOUS

## 2022-12-23 MED ORDER — HYDROMORPHONE HCL 1 MG/ML IJ SOLN: 0.5000 mg | INTRAMUSCULAR | Status: DC | PRN

## 2022-12-23 MED ORDER — TRAMADOL HCL 50 MG PO TABS
ORAL_TABLET | ORAL | Status: AC
  Filled 2022-12-23: qty 2

## 2022-12-23 MED ORDER — DEXAMETHASONE SODIUM PHOSPHATE 10 MG/ML IJ SOLN
INTRAMUSCULAR | Status: AC
  Administered 2022-12-23: 8 mg via INTRAVENOUS
  Filled 2022-12-23: qty 1

## 2022-12-23 MED ORDER — DIPHENHYDRAMINE HCL 12.5 MG/5ML PO ELIX: 12.5000 mg | ORAL_SOLUTION | ORAL | Status: DC | PRN

## 2022-12-23 MED ORDER — SENNOSIDES-DOCUSATE SODIUM 8.6-50 MG PO TABS
ORAL_TABLET | ORAL | Status: AC
  Filled 2022-12-23: qty 1

## 2022-12-23 MED ORDER — PANTOPRAZOLE SODIUM 40 MG PO TBEC
40.0000 mg | DELAYED_RELEASE_TABLET | Freq: Two times a day (BID) | ORAL | Status: DC
  Administered 2022-12-23 – 2022-12-24 (×2): 40 mg via ORAL

## 2022-12-23 MED ORDER — PANTOPRAZOLE SODIUM 40 MG PO TBEC
DELAYED_RELEASE_TABLET | ORAL | Status: AC
  Administered 2022-12-23: 40 mg via ORAL
  Filled 2022-12-23: qty 1

## 2022-12-23 MED ORDER — PHENOL 1.4 % MT LIQD: 1.0000 | OROMUCOSAL | Status: DC | PRN

## 2022-12-23 MED ORDER — ALUM & MAG HYDROXIDE-SIMETH 200-200-20 MG/5ML PO SUSP: 30.0000 mL | ORAL | Status: DC | PRN

## 2022-12-23 MED ORDER — SENNOSIDES-DOCUSATE SODIUM 8.6-50 MG PO TABS
1.0000 | ORAL_TABLET | Freq: Two times a day (BID) | ORAL | Status: DC
  Administered 2022-12-23: 1 via ORAL

## 2022-12-23 MED ORDER — OXYCODONE HCL 5 MG PO TABS
10.0000 mg | ORAL_TABLET | ORAL | Status: DC | PRN
  Administered 2022-12-24: 10 mg via ORAL

## 2022-12-23 MED ORDER — MAGNESIUM HYDROXIDE 400 MG/5ML PO SUSP
30.0000 mL | Freq: Every day | ORAL | Status: DC
  Administered 2022-12-24: 30 mL via ORAL

## 2022-12-23 MED ORDER — ACETAMINOPHEN 10 MG/ML IV SOLN
1000.0000 mg | Freq: Four times a day (QID) | INTRAVENOUS | Status: DC
  Administered 2022-12-23 – 2022-12-24 (×2): 1000 mg via INTRAVENOUS

## 2022-12-23 MED ORDER — BUPIVACAINE HCL (PF) 0.5 % IJ SOLN
INTRAMUSCULAR | Status: DC | PRN
  Administered 2022-12-23: 3 mL

## 2022-12-23 MED ORDER — METOCLOPRAMIDE HCL 10 MG PO TABS
ORAL_TABLET | ORAL | Status: AC
  Filled 2022-12-23: qty 1

## 2022-12-23 MED ORDER — MAGNESIUM HYDROXIDE 400 MG/5ML PO SUSP
ORAL | Status: AC
  Administered 2022-12-23: 30 mL via ORAL
  Filled 2022-12-23: qty 30

## 2022-12-23 MED ORDER — MIDAZOLAM HCL 2 MG/2ML IJ SOLN
INTRAMUSCULAR | Status: AC
  Filled 2022-12-23: qty 2

## 2022-12-23 MED ORDER — OXYCODONE HCL 5 MG/5ML PO SOLN: 5.0000 mg | Freq: Once | ORAL | Status: AC | PRN

## 2022-12-23 MED ORDER — METOCLOPRAMIDE HCL 10 MG PO TABS
10.0000 mg | ORAL_TABLET | Freq: Three times a day (TID) | ORAL | Status: DC
  Administered 2022-12-23 – 2022-12-24 (×2): 10 mg via ORAL

## 2022-12-23 MED ORDER — ONDANSETRON HCL 4 MG PO TABS: 4.0000 mg | ORAL_TABLET | Freq: Four times a day (QID) | ORAL | Status: DC | PRN

## 2022-12-23 MED ORDER — PROPOFOL 500 MG/50ML IV EMUL
INTRAVENOUS | Status: DC | PRN
  Administered 2022-12-23: 200 ug/kg/min via INTRAVENOUS

## 2022-12-23 MED ORDER — CHLORHEXIDINE GLUCONATE 0.12 % MT SOLN
OROMUCOSAL | Status: AC
  Administered 2022-12-23: 15 mL via OROMUCOSAL
  Filled 2022-12-23: qty 15

## 2022-12-23 MED ORDER — ALBUMIN HUMAN 5 % IV SOLN: INTRAVENOUS | Status: DC | PRN

## 2022-12-23 MED ORDER — METOCLOPRAMIDE HCL 10 MG PO TABS
ORAL_TABLET | ORAL | Status: AC
  Administered 2022-12-23: 10 mg via ORAL
  Filled 2022-12-23: qty 1

## 2022-12-23 MED ORDER — FERROUS SULFATE 325 (65 FE) MG PO TABS
ORAL_TABLET | ORAL | Status: AC
  Administered 2022-12-23: 325 mg via ORAL
  Filled 2022-12-23: qty 1

## 2022-12-23 MED ORDER — STERILE WATER FOR IRRIGATION IR SOLN
Status: DC | PRN
  Administered 2022-12-23: 1000 mL

## 2022-12-23 MED ORDER — FLEET ENEMA 7-19 GM/118ML RE ENEM: 1.0000 | ENEMA | Freq: Once | RECTAL | Status: DC | PRN

## 2022-12-23 MED ORDER — TRANEXAMIC ACID-NACL 1000-0.7 MG/100ML-% IV SOLN
INTRAVENOUS | Status: AC
  Filled 2022-12-23: qty 100

## 2022-12-23 MED ORDER — CEFAZOLIN SODIUM-DEXTROSE 2-4 GM/100ML-% IV SOLN: 2.0000 g | Freq: Four times a day (QID) | INTRAVENOUS | Status: AC

## 2022-12-23 MED ORDER — ACETAMINOPHEN 10 MG/ML IV SOLN
INTRAVENOUS | Status: DC | PRN
  Administered 2022-12-23: 1000 mg via INTRAVENOUS

## 2022-12-23 MED ORDER — SENNOSIDES-DOCUSATE SODIUM 8.6-50 MG PO TABS
ORAL_TABLET | ORAL | Status: AC
  Administered 2022-12-23: 1 via ORAL
  Filled 2022-12-23: qty 1

## 2022-12-23 MED ORDER — DROPERIDOL 2.5 MG/ML IJ SOLN: 0.6250 mg | Freq: Once | INTRAMUSCULAR | Status: DC | PRN

## 2022-12-23 MED ORDER — OXYCODONE HCL 5 MG PO TABS
5.0000 mg | ORAL_TABLET | ORAL | Status: DC | PRN
  Administered 2022-12-23: 5 mg via ORAL

## 2022-12-23 MED ORDER — ONDANSETRON HCL 4 MG/2ML IJ SOLN: 4.0000 mg | Freq: Four times a day (QID) | INTRAMUSCULAR | Status: DC | PRN

## 2022-12-23 MED ORDER — CELECOXIB 200 MG PO CAPS
ORAL_CAPSULE | ORAL | Status: AC
  Administered 2022-12-23: 200 mg via ORAL
  Filled 2022-12-23: qty 1

## 2022-12-23 MED ORDER — OXYCODONE HCL 5 MG PO TABS
5.0000 mg | ORAL_TABLET | Freq: Once | ORAL | Status: AC | PRN
  Administered 2022-12-23: 5 mg via ORAL

## 2022-12-23 MED ORDER — CEFAZOLIN SODIUM-DEXTROSE 2-4 GM/100ML-% IV SOLN
INTRAVENOUS | Status: AC
  Administered 2022-12-23: 2 g via INTRAVENOUS
  Filled 2022-12-23: qty 100

## 2022-12-23 MED ORDER — MIDAZOLAM HCL 5 MG/5ML IJ SOLN
INTRAMUSCULAR | Status: DC | PRN
  Administered 2022-12-23: 2 mg via INTRAVENOUS

## 2022-12-23 MED ORDER — ORAL CARE MOUTH RINSE: 15.0000 mL | OROMUCOSAL | Status: DC | PRN

## 2022-12-23 MED ORDER — SODIUM CHLORIDE 0.9 % IV SOLN
Status: DC | PRN
  Administered 2022-12-23: 3012 mL

## 2022-12-23 MED ORDER — GABAPENTIN 300 MG PO CAPS
ORAL_CAPSULE | ORAL | Status: AC
  Administered 2022-12-23: 300 mg via ORAL
  Filled 2022-12-23: qty 1

## 2022-12-23 MED ORDER — ONDANSETRON HCL 4 MG/2ML IJ SOLN
INTRAMUSCULAR | Status: DC | PRN
  Administered 2022-12-23: 4 mg via INTRAVENOUS

## 2022-12-23 MED ORDER — ACETAMINOPHEN 10 MG/ML IV SOLN
INTRAVENOUS | Status: AC
  Administered 2022-12-23: 1000 mg via INTRAVENOUS
  Filled 2022-12-23: qty 100

## 2022-12-23 MED ORDER — CEFAZOLIN SODIUM-DEXTROSE 2-4 GM/100ML-% IV SOLN
INTRAVENOUS | Status: AC
  Filled 2022-12-23: qty 100

## 2022-12-23 MED ORDER — ENOXAPARIN SODIUM 40 MG/0.4ML IJ SOSY
40.0000 mg | PREFILLED_SYRINGE | INTRAMUSCULAR | Status: DC
  Administered 2022-12-24: 40 mg via SUBCUTANEOUS

## 2022-12-23 MED ORDER — PANTOPRAZOLE SODIUM 40 MG PO TBEC
DELAYED_RELEASE_TABLET | ORAL | Status: AC
  Filled 2022-12-23: qty 1

## 2022-12-23 MED ORDER — SODIUM CHLORIDE 0.9 % IR SOLN
Status: DC | PRN
  Administered 2022-12-23: 502 mL

## 2022-12-23 MED ORDER — ACETAMINOPHEN 325 MG PO TABS: 325.0000 mg | ORAL_TABLET | Freq: Four times a day (QID) | ORAL | Status: DC | PRN

## 2022-12-23 SURGICAL SUPPLY — 55 items
ARTICULEZE HEAD (Hips) ×1 IMPLANT
BLADE SAW 90X25X1.19 OSCILLAT (BLADE) ×1 IMPLANT
CUP ACETBLR 52 OD 100 SERIES (Hips) IMPLANT
DRAPE 3/4 80X56 (DRAPES) ×1 IMPLANT
DRAPE INCISE IOBAN 66X60 STRL (DRAPES) ×1 IMPLANT
DRSG MEPILEX SACRM 8.7X9.8 (GAUZE/BANDAGES/DRESSINGS) ×1 IMPLANT
DRSG NON-ADHERENT DERMACEA 3X4 (GAUZE/BANDAGES/DRESSINGS) ×1 IMPLANT
DRSG OPSITE POSTOP 4X12 (GAUZE/BANDAGES/DRESSINGS) ×1 IMPLANT
DRSG OPSITE POSTOP 4X14 (GAUZE/BANDAGES/DRESSINGS) IMPLANT
DRSG TEGADERM 4X4.75 (GAUZE/BANDAGES/DRESSINGS) ×1 IMPLANT
DURAPREP 26ML APPLICATOR (WOUND CARE) ×2 IMPLANT
ELECT CAUTERY BLADE 6.4 (BLADE) ×1 IMPLANT
ELECT REM PT RETURN 9FT ADLT (ELECTROSURGICAL) ×1
ELECTRODE REM PT RTRN 9FT ADLT (ELECTROSURGICAL) ×1 IMPLANT
GLOVE BIO SURGEON STRL SZ7.5 (GLOVE) ×2 IMPLANT
GLOVE BIOGEL M STRL SZ7.5 (GLOVE) ×2 IMPLANT
GLOVE SRG 8 PF TXTR STRL LF DI (GLOVE) ×1 IMPLANT
GLOVE SURG UNDER POLY LF SZ8 (GLOVE) ×1
GOWN STRL REUS W/ TWL LRG LVL3 (GOWN DISPOSABLE) ×2 IMPLANT
GOWN STRL REUS W/TWL LRG LVL3 (GOWN DISPOSABLE) ×2
GOWN TOGA ZIPPER T7+ PEEL AWAY (MISCELLANEOUS) ×1 IMPLANT
HEAD ARTICULEZE (Hips) IMPLANT
HEMOVAC 400CC 10FR (MISCELLANEOUS) ×1 IMPLANT
HOLDER FOLEY CATH W/STRAP (MISCELLANEOUS) ×1 IMPLANT
HOOD PEEL AWAY T7 (MISCELLANEOUS) ×1 IMPLANT
IV NS IRRIG 3000ML ARTHROMATIC (IV SOLUTION) ×1 IMPLANT
JET LAVAGE IRRISEPT WOUND (IRRIGATION / IRRIGATOR) ×1
KIT PEG BOARD PINK (KITS) ×1 IMPLANT
KIT TURNOVER KIT A (KITS) ×1 IMPLANT
LAVAGE JET IRRISEPT WOUND (IRRIGATION / IRRIGATOR) IMPLANT
LINER ACETAB NEUTRAL 36ID 520D (Liner) IMPLANT
MANIFOLD NEPTUNE II (INSTRUMENTS) ×2 IMPLANT
NDL SAFETY ECLIP 18X1.5 (MISCELLANEOUS) ×1 IMPLANT
NS IRRIG 1000ML POUR BTL (IV SOLUTION) ×1 IMPLANT
PACK HIP PROSTHESIS (MISCELLANEOUS) ×1 IMPLANT
PULSAVAC PLUS IRRIG FAN TIP (DISPOSABLE) ×1
SOL PREP PVP 2OZ (MISCELLANEOUS) ×1
SOLUTION IRRIG SURGIPHOR (IV SOLUTION) ×1 IMPLANT
SOLUTION PREP PVP 2OZ (MISCELLANEOUS) ×1 IMPLANT
SPONGE DRAIN TRACH 4X4 STRL 2S (GAUZE/BANDAGES/DRESSINGS) ×1 IMPLANT
STAPLER SKIN PROX 35W (STAPLE) ×1 IMPLANT
STEM FEMORAL SZ 5MM STD ACTIS (Stem) IMPLANT
SUT ETHIBOND #5 BRAIDED 30INL (SUTURE) ×1 IMPLANT
SUT VIC AB 0 CT1 36 (SUTURE) ×1 IMPLANT
SUT VIC AB 1 CT1 36 (SUTURE) ×2 IMPLANT
SUT VIC AB 2-0 CT1 27 (SUTURE) ×1
SUT VIC AB 2-0 CT1 TAPERPNT 27 (SUTURE) ×1 IMPLANT
SYR 20ML LL LF (SYRINGE) ×1 IMPLANT
TAPE CLOTH 3X10 WHT NS LF (GAUZE/BANDAGES/DRESSINGS) ×1 IMPLANT
TAPE TRANSPORE STRL 2 31045 (GAUZE/BANDAGES/DRESSINGS) ×1 IMPLANT
TIP FAN IRRIG PULSAVAC PLUS (DISPOSABLE) ×1 IMPLANT
TOWEL OR 17X26 4PK STRL BLUE (TOWEL DISPOSABLE) IMPLANT
TRAP FLUID SMOKE EVACUATOR (MISCELLANEOUS) ×1 IMPLANT
TRAY FOLEY MTR SLVR 16FR STAT (SET/KITS/TRAYS/PACK) ×1 IMPLANT
WATER STERILE IRR 1000ML POUR (IV SOLUTION) ×1 IMPLANT

## 2022-12-23 NOTE — Progress Notes (Signed)
Pt voided on BSC 300 ml after voiding bladder scanned with no urine in bladder.

## 2022-12-23 NOTE — Transfer of Care (Signed)
Immediate Anesthesia Transfer of Care Note  Patient: Courtney Cervantes  Procedure(s) Performed: TOTAL HIP ARTHROPLASTY (Left: Hip)  Patient Location: PACU  Anesthesia Type:Spinal  Level of Consciousness: awake and sedated  Airway & Oxygen Therapy: Patient Spontanous Breathing and Patient connected to face mask oxygen  Post-op Assessment: Report given to RN and Post -op Vital signs reviewed and stable  Post vital signs: Reviewed and stable  Last Vitals:  Vitals Value Taken Time  BP    Temp    Pulse    Resp    SpO2      Last Pain:  Vitals:   12/23/22 0619  TempSrc: Temporal  PainSc: 7          Complications: No notable events documented.

## 2022-12-23 NOTE — Progress Notes (Signed)
Orthopaedics: Patient is not able to walk the distance required to go the bathroom, or he/she is unable to safely negotiate stairs required to access the bathroom.  A 3in1 BSC will alleviate this problem    Courtney Cervantes P. Enas Winchel, Jr. M.D.  

## 2022-12-23 NOTE — Interval H&P Note (Signed)
History and Physical Interval Note:  12/23/2022 5:57 AM  Courtney Cervantes  has presented today for surgery, with the diagnosis of PRIMARY OSTEOARTHRITIS OF LEFT HIP.Marland Kitchen  The various methods of treatment have been discussed with the patient and family. After consideration of risks, benefits and other options for treatment, the patient has consented to  Procedure(s): [place] (Left) as a surgical intervention.  The patient's history has been reviewed, patient examined, no change in status, stable for surgery.  I have reviewed the patient's chart and labs.  Questions were answered to the patient's satisfaction.     [place]

## 2022-12-23 NOTE — Evaluation (Signed)
Physical Therapy Evaluation Patient Details Name: Courtney Cervantes MRN: [id number] DOB: 07/17/1956 Today's Date: 12/23/2022  History of Present Illness  Pt is a 67 yo F diagnosed with degenerative arthrosis of the left hip and is s/p elective L THA.  PMH includes osteoarthritis and GERD.   Clinical Impression  Pt was pleasant and motivated to participate during the session and put forth good effort throughout. Pt required no physical assistance during the session but did require frequent cuing for proper sequencing to ensure posterior hip precaution compliance.  Pt was generally steady in standing and during gait with no overt LOB and with no adverse symptoms other than L hip pain which seemed to be the limiting factor in her inability to ambulate further distances this session.  Pt's HR and SpO2 were both WNL on room air.  Pt is anticipated to make good progress while in acute care and will benefit from HHPT upon discharge to safely address deficits listed in patient problem list for decreased caregiver assistance and eventual return to PLOF.         Recommendations for follow up therapy are one component of a multi-disciplinary discharge planning process, led by the attending physician.  Recommendations may be updated based on patient status, additional functional criteria and insurance authorization.  Follow Up Recommendations Home health PT      Assistance Recommended at Discharge Intermittent Supervision/Assistance  Patient can return home with the following  A little help with walking and/or transfers;A little help with bathing/dressing/bathroom;Assistance with cooking/housework;Assist for transportation;Help with stairs or ramp for entrance    Equipment Recommendations BSC/3in1  Recommendations for Other Services       Functional Status Assessment Patient has had a recent decline in their functional status and demonstrates the ability to make significant improvements in  function in a reasonable and predictable amount of time.     Precautions / Restrictions Precautions Precautions: Fall;Posterior Hip Precaution Booklet Issued: Yes (comment) Restrictions Weight Bearing Restrictions: Yes LLE Weight Bearing: Weight bearing as tolerated      Mobility  Bed Mobility Overal bed mobility: Modified Independent             General bed mobility comments: Mod extra time and effort only with cues for sequencing for hip precaution compliance    Transfers Overall transfer level: Needs assistance Equipment used: Rolling walker (2 wheels) Transfers: Sit to/from Stand Sit to Stand: Min guard, From elevated surface           General transfer comment: Mod multi-modal cues for sequencing for hip precaution compliance; good eccentric and concentric control and stability    Ambulation/Gait Ambulation/Gait assistance: Min guard Gait Distance (Feet): 3 Feet Assistive device: Rolling walker (2 wheels) Gait Pattern/deviations: Step-to pattern, Trunk flexed, Decreased step length - right, Decreased stance time - left, Antalgic Gait velocity: decreased     General Gait Details: Pt only able to take several small steps near the EOB before fatiguing and needing to return to sitting  Stairs            Wheelchair Mobility    Modified Rankin (Stroke Patients Only)       Balance Overall balance assessment: Needs assistance   Sitting balance-Leahy Scale: Normal     Standing balance support: Bilateral upper extremity supported, During functional activity Standing balance-Leahy Scale: Good  Pertinent Vitals/Pain Pain Assessment Pain Assessment: 0-10 Pain Score: 2  Pain Location: L hip Pain Descriptors / Indicators: Aching, Sore Pain Intervention(s): Repositioned, Ice applied, Premedicated before session, Monitored during session    [place] expects to be discharged to:: Private  residence Living Arrangements: Other relatives (Pt lives with son-in-law) Available Help at Discharge: Family;Friend(s);Available 24 hours/day Type of Home: House Home Access: Stairs to enter Entrance Stairs-Rails: Right;Left;Can reach both Entrance Stairs-Number of Steps: 4   Home Layout: Two level;Able to live on main level with bedroom/bathroom Home Equipment: Rolling Walker (2 wheels)      Prior Function Prior Level of Function : Independent/Modified Independent             Mobility Comments: Ind amb community distances without an AD, no fall history ADLs Comments: Ind with ADLs     Hand Dominance        Extremity/Trunk Assessment   Upper Extremity Assessment Upper Extremity Assessment: Overall WFL for tasks assessed    Lower Extremity Assessment Lower Extremity Assessment: LLE deficits/detail LLE Deficits / Details: BLE ankle strength, AROM, and sensation to light touch grossly WNL LLE: Unable to fully assess due to pain       Communication   Communication: No difficulties  Cognition Arousal/Alertness: Awake/alert Behavior During Therapy: WFL for tasks assessed/performed Overall Cognitive Status: Within Functional Limits for tasks assessed                                          General Comments      Exercises Total Joint Exercises Ankle Circles/Pumps: AROM, Strengthening, Both, 10 reps Quad Sets: Strengthening, Both, 10 reps Gluteal Sets: Strengthening, Both, 10 reps Hip ABduction/ADduction: Strengthening, Right, 5 reps Straight Leg Raises: Strengthening, Right, 5 reps Long Arc Quad: Strengthening, Both, 10 reps Knee Flexion: Strengthening, Both, 10 reps Other Exercises Other Exercises: Posterior hip precaution education verbally, with handout review, and during functional task training Other Exercises: HEP education per handout   Assessment/Plan    PT Assessment Patient needs continued PT services  PT Problem List Decreased  strength;Decreased activity tolerance;Decreased balance;Decreased mobility;Decreased knowledge of use of DME;Decreased knowledge of precautions;Pain       PT Treatment Interventions DME instruction;Gait training;Stair training;Functional mobility training;Therapeutic activities;Therapeutic exercise;Balance training;Patient/family education    PT Goals (Current goals can be found in the Care Plan section)  Acute Rehab PT Goals Patient Stated Goal: To get back in shape PT Goal Formulation: With patient Time For Goal Achievement: 01/05/23 Potential to Achieve Goals: Good    Frequency BID     Co-evaluation               AM-PAC PT "6 Clicks" Mobility  Outcome Measure Help needed turning from your back to your side while in a flat bed without using bedrails?: A Little Help needed moving from lying on your back to sitting on the side of a flat bed without using bedrails?: A Little Help needed moving to and from a bed to a chair (including a wheelchair)?: A Little Help needed standing up from a chair using your arms (e.g., wheelchair or bedside chair)?: A Little Help needed to walk in hospital room?: A Lot Help needed climbing 3-5 steps with a railing? : Total 6 Click Score: 15    End of Session Equipment Utilized During Treatment: Gait belt Activity Tolerance: Patient tolerated treatment well Patient left: in  bed;with call bell/phone within reach;with bed alarm set Nurse Communication: Mobility status;Weight bearing status;Precautions PT Visit Diagnosis: Other abnormalities of gait and mobility (R26.89);Muscle weakness (generalized) (M62.81);Pain Pain - Right/Left: Left Pain - part of body: Hip    Time: 1610-9604 PT Time Calculation (min) (ACUTE ONLY): 46 min   Charges:   PT Evaluation $PT Eval Moderate Complexity: 1 Mod PT Treatments $Therapeutic Exercise: 8-22 mins $Therapeutic Activity: 8-22 mins       D. Elly Modena PT, DPT 12/23/22, 5:22 PM

## 2022-12-23 NOTE — Plan of Care (Signed)
Pt ambulating around the nurses station with RN tolerating well

## 2022-12-23 NOTE — TOC Progression Note (Signed)
Transition of Care [facility]) - Progression Note    Patient Details  Name: Courtney Cervantes MRN: [id number] Date of Birth: 1955-12-15  Transition of Care [facility]) CM/SW [place], RN Phone Number: 12/23/2022, 4:22 PM  Clinical Narrative:    Reacehed out to Darnelle Maffucci with [employer] to inquire how much would ins cover of a 3 in1 for the patient, waiting on a response        Expected Discharge Plan and Services                                               Social Determinants of Health (SDOH) Interventions SDOH Screenings   Food Insecurity: No Food Insecurity (12/23/2022)  Housing: Low Risk  (12/23/2022)  Transportation Needs: No Transportation Needs (12/23/2022)  Utilities: Not At Risk (12/23/2022)  Alcohol Screen: Low Risk  (03/05/2022)  Depression (PHQ2-9): Low Risk  (03/05/2022)  Physical Activity: Insufficiently Active (01/28/2022)  Social Connections: Socially Isolated (01/28/2022)  Stress: Stress Concern Present (01/28/2022)  Tobacco Use: High Risk (12/23/2022)    Readmission Risk Interventions     No data to display

## 2022-12-23 NOTE — Anesthesia Procedure Notes (Addendum)
Spinal  Patient location during procedure: OR Start time: 12/23/2022 7:20 AM End time: 12/23/2022 7:24 AM Reason for block: surgical anesthesia Staffing Performed: resident/CRNA  Resident/CRNA: Nelda Marseille, CRNA Performed by: Nelda Marseille, CRNA Authorized by: Iran Ouch, MD   Preanesthetic Checklist Completed: patient identified, IV checked, site marked, risks and benefits discussed, surgical consent, monitors and equipment checked, pre-op evaluation and timeout performed Spinal Block Patient position: sitting Prep: ChloraPrep Patient monitoring: heart rate, continuous pulse ox, blood pressure and cardiac monitor Approach: midline Location: L3-4 Injection technique: single-shot Needle Needle type: [place]acre and Introducer  Needle gauge: 25 G Needle length: 9 cm Assessment Sensory level: T10 Events: CSF return Additional Notes Sterile aseptic technique used throughout the procedure.  Negative paresthesia. Negative blood return. Positive free-flowing CSF. Expiration date of kit checked and confirmed. Patient tolerated procedure well, without complications.

## 2022-12-23 NOTE — Progress Notes (Signed)
Patient in a lot of pain upon arrival to pacu from the operating room. Already moving legs and wiggling toes, will administer pain medication pain is 10/10 in left hip surgical site.

## 2022-12-23 NOTE — Progress Notes (Signed)
Pt ambulated around the nurses station twice with walker accompanied by RN, pt tolerated ambulation well denies pain at this time.

## 2022-12-23 NOTE — TOC Progression Note (Signed)
Transition of Care [facility]) - Progression Note    Patient Details  Name: Courtney Cervantes MRN: [id number] Date of Birth: 07-03-1956  Transition of Care [facility]) CM/SW [place], RN Phone Number: 12/23/2022, 4:15 PM  Clinical Narrative:     Patient was set up with [place] [facility] services prior to surgery by surgeons office       Expected Discharge Plan and Services                                               Social Determinants of Health (SDOH) Interventions SDOH Screenings   Food Insecurity: No Food Insecurity (12/23/2022)  Housing: Low Risk  (12/23/2022)  Transportation Needs: No Transportation Needs (12/23/2022)  Utilities: Not At Risk (12/23/2022)  Alcohol Screen: Low Risk  (03/05/2022)  Depression (PHQ2-9): Low Risk  (03/05/2022)  Physical Activity: Insufficiently Active (01/28/2022)  Social Connections: Socially Isolated (01/28/2022)  Stress: Stress Concern Present (01/28/2022)  Tobacco Use: High Risk (12/23/2022)    Readmission Risk Interventions     No data to display

## 2022-12-23 NOTE — Anesthesia Procedure Notes (Signed)
Date/Time: 12/23/2022 7:26 AM  Performed by: Nelda Marseille, CRNAPre-anesthesia Checklist: Patient identified, Emergency Drugs available, Suction available, Patient being monitored and Timeout performed

## 2022-12-23 NOTE — Op Note (Signed)
OPERATIVE NOTE  DATE OF SURGERY:  12/23/2022  PATIENT NAME:  Courtney Cervantes   DOB: [birth year]  MRN: [id number]  PRE-OPERATIVE DIAGNOSIS: Degenerative arthrosis of the left hip, primary  POST-OPERATIVE DIAGNOSIS:  Same  PROCEDURE:  Left total hip arthroplasty  SURGEON:  Marciano Sequin. M.D.  ANESTHESIA: spinal  ESTIMATED BLOOD LOSS: 150 mL  FLUIDS REPLACED: 2000 mL of crystalloid  DRAINS: 2 medium Hemovac drains  IMPLANTS UTILIZED: DePuy size 5 standard offset Actis femoral stem, 52 mm OD Pinnacle 100 acetabular component,  neutral Pinnacle Marathon polyethylene insert, and a 36 mm M-SPEC +5 mm hip ball  INDICATIONS FOR SURGERY: CYNDE MENARD is a 67 y.o. year old female with a long history of progressive hip and groin  pain. X-rays demonstrated severe degenerative changes. The patient had not seen any significant improvement despite conservative nonsurgical intervention. After discussion of the risks and benefits of surgical intervention, the patient expressed understanding of the risks benefits and agree with plans for total hip arthroplasty.   The risks, benefits, and alternatives were discussed at length including but not limited to the risks of infection, bleeding, nerve injury, stiffness, blood clots, the need for revision surgery, limb length inequality, dislocation, cardiopulmonary complications, among others, and they were willing to proceed.  PROCEDURE IN DETAIL: The patient was brought into the operating room and, after adequate spinal anesthesia was achieved, the patient was placed in a right lateral decubitus position. Axillary roll was placed and all bony prominences were well-padded. The patient's left hip was cleaned and prepped with alcohol and DuraPrep and draped in the usual sterile fashion. A "timeout" was performed as per usual protocol. A lateral curvilinear incision was made gently curving towards the posterior superior iliac spine. The IT band was  incised in line with the skin incision and the fibers of the gluteus maximus were split in line. The piriformis tendon was identified, skeletonized, and incised at its insertion to the proximal femur and reflected posteriorly. A T type posterior capsulotomy was performed. Prior to dislocation of the femoral head, a threaded Steinmann pin was inserted through a separate stab incision into the pelvis superior to the acetabulum and bent in the form of a stylus so as to assess limb length and hip offset throughout the procedure. The femoral head was then dislocated posteriorly. Inspection of the femoral head demonstrated severe degenerative changes with full-thickness loss of articular cartilage. The femoral neck cut was performed using an oscillating saw. The anterior capsule was elevated off of the femoral neck using a periosteal elevator. Attention was then directed to the acetabulum. The remnant of the labrum was excised using electrocautery. Inspection of the acetabulum also demonstrated significant degenerative changes. The acetabulum was reamed in sequential fashion up to a 51 mm diameter. Good punctate bleeding bone was encountered. A 52 mm Pinnacle 100 acetabular component was positioned and impacted into place. Good scratch fit was appreciated. A neutral polyethylene trial was inserted.  Attention was then directed to the proximal femur.  Femoral broaches were inserted in a sequential fashion up to a size 5 broach. Calcar region was planed and a trial reduction was performed using a standard offset neck and a 36 mm hip ball with a +5 mm neck length. Good equalization of limb lengths and hip offset was appreciated and excellent stability was noted both anteriorly and posteriorly. Trial components were removed. The acetabular shell was irrigated with copious amounts of normal saline with antibiotic solution and suctioned dry. A neutral  Pinnacle Marathon polyethylene insert was positioned and impacted into  place. Next, a size 5 standard offset Actis femoral stem was positioned and impacted into place. Excellent scratch fit was appreciated. A trial reduction was again performed with a 36 mm hip ball with a +5 mm neck length. Again, good equalization of limb lengths was appreciated and excellent stability appreciated both anteriorly and posteriorly. The hip was then dislocated and the trial hip ball was removed. The Morse taper was cleaned and dried. A 36 mm M-SPEC hip ball with a +5 mm neck length was placed on the trunnion and impacted into place. The hip was then reduced and placed through range of motion. Excellent stability was appreciated both anteriorly and posteriorly.  The wound was irrigated with copious amounts of normal saline followed by 450 ml of Surgiphor and suctioned dry. Good hemostasis was appreciated. The posterior capsulotomy was repaired using #5 Ethibond. Piriformis tendon was reapproximated to the undersurface of the gluteus medius tendon using #5 Ethibond. The IT band was reapproximated using interrupted sutures of #1 Vicryl. Subcutaneous tissue was approximated using first #0 Vicryl followed by #2-0 Vicryl. The skin was closed with skin staples.  The patient tolerated the procedure well and was transported to the recovery room in stable condition.   Marciano Sequin., M.D.

## 2022-12-24 ENCOUNTER — Encounter: Payer: Self-pay | Admitting: Orthopedic Surgery

## 2022-12-24 DIAGNOSIS — M1612 Unilateral primary osteoarthritis, left hip: Secondary | ICD-10-CM | POA: Diagnosis not present

## 2022-12-24 LAB — SURGICAL PATHOLOGY

## 2022-12-24 MED ORDER — OXYCODONE HCL 5 MG PO TABS: 5.0000 mg | ORAL_TABLET | ORAL | 0 refills | Status: AC | PRN

## 2022-12-24 MED ORDER — MAGNESIUM HYDROXIDE 400 MG/5ML PO SUSP
ORAL | Status: AC
  Filled 2022-12-24: qty 30

## 2022-12-24 MED ORDER — FERROUS SULFATE 325 (65 FE) MG PO TABS
ORAL_TABLET | ORAL | Status: AC
  Administered 2022-12-24: 325 mg via ORAL
  Filled 2022-12-24: qty 1

## 2022-12-24 MED ORDER — CELECOXIB 200 MG PO CAPS
ORAL_CAPSULE | ORAL | Status: AC
  Filled 2022-12-24: qty 1

## 2022-12-24 MED ORDER — TRAMADOL HCL 50 MG PO TABS: 50.0000 mg | ORAL_TABLET | ORAL | 0 refills | Status: AC | PRN

## 2022-12-24 MED ORDER — OXYCODONE HCL 5 MG PO TABS
ORAL_TABLET | ORAL | Status: AC
  Filled 2022-12-24: qty 2

## 2022-12-24 MED ORDER — SENNA 8.6 MG PO TABS
ORAL_TABLET | ORAL | Status: AC
  Filled 2022-12-24: qty 1

## 2022-12-24 MED ORDER — PANTOPRAZOLE SODIUM 40 MG PO TBEC
DELAYED_RELEASE_TABLET | ORAL | Status: AC
  Filled 2022-12-24: qty 1

## 2022-12-24 MED ORDER — ENOXAPARIN SODIUM 40 MG/0.4ML IJ SOSY: 40.0000 mg | PREFILLED_SYRINGE | INTRAMUSCULAR | 0 refills | Status: AC

## 2022-12-24 MED ORDER — METOCLOPRAMIDE HCL 10 MG PO TABS
ORAL_TABLET | ORAL | Status: AC
  Filled 2022-12-24: qty 1

## 2022-12-24 MED ORDER — ENOXAPARIN SODIUM 40 MG/0.4ML IJ SOSY
PREFILLED_SYRINGE | INTRAMUSCULAR | Status: AC
  Filled 2022-12-24: qty 0.4

## 2022-12-24 MED ORDER — ACETAMINOPHEN 10 MG/ML IV SOLN
INTRAVENOUS | Status: AC
  Filled 2022-12-24: qty 100

## 2022-12-24 MED ORDER — CELECOXIB 200 MG PO CAPS: 200.0000 mg | ORAL_CAPSULE | Freq: Two times a day (BID) | ORAL | 1 refills | Status: AC

## 2022-12-24 NOTE — Progress Notes (Signed)
Physical Therapy Treatment Patient Details Name: Courtney Cervantes MRN: [id number] DOB: May 09, 1956 Today's Date: 12/24/2022   History of Present Illness Pt is a 67 yo F diagnosed with degenerative arthrosis of the left hip and is s/p elective L THA.  PMH includes osteoarthritis and GERD.    PT Comments    Pt was long sitting in bed upon arriving. She is A and O x 4 and agreeable to session. She demonstrated safe abilities to exit bed, stand, and ambulate without RW + supervision only. Vcs during ambulation for posture correction and improved heel strike to toe off pattern. Pt also safely performed ascending/descending stairs without concerns. Discussed routine mobility, precautions, importance of icing and car transfers. Pt is cleared from an acute PT standpoint for safe DC home with HHPT to follow. Pt endorses having RW but will need BSC prior to going home.    Recommendations for follow up therapy are one component of a multi-disciplinary discharge planning process, led by the attending physician.  Recommendations may be updated based on patient status, additional functional criteria and insurance authorization.  Follow Up Recommendations  Home health PT     Assistance Recommended at Discharge Set up Supervision/Assistance  Patient can return home with the following A little help with walking and/or transfers;A little help with bathing/dressing/bathroom;Assistance with cooking/housework;Assist for transportation   Equipment Recommendations  BSC/3in1       Precautions / Restrictions Precautions Precautions: Fall;Posterior Hip Precaution Booklet Issued: Yes (comment) Restrictions Weight Bearing Restrictions: Yes LLE Weight Bearing: Weight bearing as tolerated     Mobility  Bed Mobility Overal bed mobility: Modified Independent  General bed mobility comments: No physical assistance required to safely exit bed.    Transfers Overall transfer level: Needs assistance Equipment  used: Rolling walker (2 wheels) Transfers: Sit to/from Stand Sit to Stand: Supervision  General transfer comment: No physical assistance or safety concern.    Ambulation/Gait Ambulation/Gait assistance: Supervision Gait Distance (Feet): 200 Feet Assistive device: Rolling walker (2 wheels) Gait Pattern/deviations: Step-through pattern, Antalgic Gait velocity: decreased     General Gait Details: Pt demonstrated safe ability to ambulate with use of RW. VCs for improved sequencing, heel strike, and posture correction.   Stairs Stairs: Yes Stairs assistance: Supervision Stair Management: Two rails, Step to pattern, Forwards Number of Stairs: 4 General stair comments: pt demonstrated safe ability to perform ascending/descending stairs without LOB or safety concerns   Balance Overall balance assessment: Modified Independent      Cognition Arousal/Alertness: Awake/alert Behavior During Therapy: WFL for tasks assessed/performed Overall Cognitive Status: Within Functional Limits for tasks assessed    General Comments: Pt is A and O x 4. Slightly anxious but overall tolerated session well.               Pertinent Vitals/Pain Pain Assessment Pain Assessment: 0-10 Pain Score: 4  Pain Location: L hip Pain Descriptors / Indicators: Aching, Sore Pain Intervention(s): Limited activity within patient's tolerance, Monitored during session, Premedicated before session, Repositioned     PT Goals (current goals can now be found in the care plan section) Acute Rehab PT Goals Patient Stated Goal: go home Progress towards PT goals: Progressing toward goals    Frequency    BID      PT Plan Current plan remains appropriate       AM-PAC PT "6 Clicks" Mobility   Outcome Measure  Help needed turning from your back to your side while in a flat bed without using bedrails?: A Little  Help needed moving from lying on your back to sitting on the side of a flat bed without using  bedrails?: A Little Help needed moving to and from a bed to a chair (including a wheelchair)?: A Little Help needed standing up from a chair using your arms (e.g., wheelchair or bedside chair)?: A Little Help needed to walk in hospital room?: A Little Help needed climbing 3-5 steps with a railing? : A Little 6 Click Score: 18    End of Session   Activity Tolerance: Patient tolerated treatment well Patient left: in chair;with call bell/phone within reach;with chair alarm set;with family/visitor present Nurse Communication: Mobility status;Weight bearing status;Precautions PT Visit Diagnosis: Other abnormalities of gait and mobility (R26.89);Muscle weakness (generalized) (M62.81);Pain Pain - Right/Left: Left Pain - part of body: Hip     Time: 8032-1224 PT Time Calculation (min) (ACUTE ONLY): 20 min  Charges:  $Gait Training: 8-22 mins                     Julaine Fusi PTA 12/24/22, 8:18 AM

## 2022-12-24 NOTE — Anesthesia Postprocedure Evaluation (Signed)
Anesthesia Post Note  Patient: Courtney Cervantes  Procedure(s) Performed: TOTAL HIP ARTHROPLASTY (Left: Hip)  Patient location during evaluation: Nursing Unit Anesthesia Type: Spinal Level of consciousness: awake, awake and alert, oriented and patient cooperative Pain management: pain level controlled Vital Signs Assessment: post-procedure vital signs reviewed and stable Respiratory status: spontaneous breathing, nonlabored ventilation and respiratory function stable Cardiovascular status: stable Postop Assessment: no headache, no backache, patient able to bend at knees, able to ambulate, adequate PO intake and no apparent nausea or vomiting Anesthetic complications: no   No notable events documented.   Last Vitals:  Vitals:   12/23/22 2005 12/24/22 0352  BP: 110/73 101/68  Pulse: (!) 108 (!) 111  Resp: 16 16  Temp: (!) 36.4 C 37.1 C  SpO2: 98% 96%    Last Pain:  Vitals:   12/24/22 0352  TempSrc: Temporal  PainSc: 2                  Mannix Kroeker,  Sorah Falkenstein R

## 2022-12-24 NOTE — Progress Notes (Signed)
  Subjective: 1 Day Post-Op Procedure(s) (LRB): TOTAL HIP ARTHROPLASTY (Left) Patient reports pain as mild.   Patient is well, and has had no acute complaints or problems Plan is to go Home after hospital stay. Negative for chest pain and shortness of breath Fever: no Gastrointestinal: Negative for nausea and vomiting  Objective: Vital signs in last 24 hours: Temp:  [97.5 F (36.4 C)-98.8 F (37.1 C)] 98.8 F (37.1 C) (01/23 0352) Pulse Rate:  [103-113] 111 (01/23 0352) Resp:  [11-18] 16 (01/23 0352) BP: (101-127)/(66-87) 101/68 (01/23 0352) SpO2:  [96 %-100 %] 96 % (01/23 0352)  Intake/Output from previous day:  Intake/Output Summary (Last 24 hours) at 12/24/2022 0716 Last data filed at 12/24/2022 0400 Gross per 24 hour  Intake 4192.49 ml  Output 3095 ml  Net 1097.49 ml    Intake/Output this shift: No intake/output data recorded.  Labs: No results for input(s): "HGB" in the last 72 hours. No results for input(s): "WBC", "RBC", "HCT", "PLT" in the last 72 hours. No results for input(s): "NA", "K", "CL", "CO2", "BUN", "CREATININE", "GLUCOSE", "CALCIUM" in the last 72 hours. No results for input(s): "LABPT", "INR" in the last 72 hours.   EXAM General - Patient is Alert and Oriented Extremity - Neurovascular intact Sensation intact distally Dorsiflexion/Plantar flexion intact Compartment soft Dressing/Incision - clean, dry, with the Hemovac tubing removed with no complication.  The Hemovac tubing was intact on removal. Motor Function - intact, moving foot and toes well on exam.   Past Medical History:  Diagnosis Date   Arthritis    GERD (gastroesophageal reflux disease)     Assessment/Plan: 1 Day Post-Op Procedure(s) (LRB): TOTAL HIP ARTHROPLASTY (Left) Principal Problem:   Hx of total hip arthroplasty, left  Estimated body mass index is 32.62 kg/m as calculated from the following:   Height as of this encounter: 5\' 9"  (1.753 m).   Weight as of this  encounter: 100.2 kg. Advance diet Up with therapy D/C IV fluids Discharge home with home health  DVT Prophylaxis - Lovenox, Foot Pumps, and TED hose Weight-Bearing as tolerated to left leg  Reche Dixon, PA-C Orthopaedic Surgery 12/24/2022, 7:16 AM

## 2022-12-24 NOTE — TOC Progression Note (Addendum)
Transition of Care [facility]) - Progression Note    Patient Details  Name: Courtney Cervantes MRN: [id number] Date of Birth: [birth year]  Transition of Care [facility]) CM/SW [place], RN Phone Number: 12/24/2022, 8:27 AM  Clinical Narrative:   [employer] to deliver DME to the bedside prior to DC today    Expected Discharge Plan: [place] Barriers to Discharge: No Barriers Identified  Expected Discharge Plan and Services   Discharge Planning Services: CM Consult     Expected Discharge Date: 12/24/22               DME Arranged: Gilford Rile rolling, 3-N-1 DME Agency: [employer] Date DME Agency Contacted: 12/24/22 Time DME Agency Contacted: [phone] Representative spoke with at DME Agency: [place]: PT [place]: [place] Date [place]: 12/24/22 Time [place]: [phone] Representative spoke with at [place]: [place]   Social Determinants of Health ([place]) Interventions SDOH Screenings   Food Insecurity: No Food Insecurity (12/23/2022)  Housing: Low Risk  (12/23/2022)  Transportation Needs: No Transportation Needs (12/23/2022)  Utilities: Not At Risk (12/23/2022)  Alcohol Screen: Low Risk  (03/05/2022)  Depression (PHQ2-9): Low Risk  (03/05/2022)  Physical Activity: Insufficiently Active (01/28/2022)  Social Connections: Socially Isolated (01/28/2022)  Stress: Stress Concern Present (01/28/2022)  Tobacco Use: High Risk (12/23/2022)    Readmission Risk Interventions     No data to display

## 2022-12-24 NOTE — Discharge Summary (Signed)
Physician Discharge Summary  Subjective: 1 Day Post-Op Procedure(s) (LRB): TOTAL HIP ARTHROPLASTY (Left) Patient reports pain as mild.   Patient seen in rounds with Dr. Marry Guan. Patient is well, and has had no acute complaints or problems Patient is ready to go home after physical therapy.  Physician Discharge Summary  Patient ID: Courtney Cervantes MRN: [id number] DOB/AGE: 1956/05/05 67 y.o.  Admit date: 12/23/2022 Discharge date: 12/24/2022  Admission Diagnoses:  Discharge Diagnoses:  Principal Problem:   Hx of total hip arthroplasty, left   Discharged Condition: fair  Hospital Course: The patient is postop day 1 from a left total hip arthroplasty.  She is doing well since surgery.  Her pain has become manageable.  Her vitals have been stable.  She is ready to do physical therapy and then will be discharged home.  Treatments: surgery:   Left total hip arthroplasty   SURGEON:  Marciano Sequin. M.D.   ANESTHESIA: spinal   ESTIMATED BLOOD LOSS: 150 mL   FLUIDS REPLACED: 2000 mL of crystalloid   DRAINS: 2 medium Hemovac drains   IMPLANTS UTILIZED: DePuy size 5 standard offset Actis femoral stem, 52 mm OD Pinnacle 100 acetabular component,  neutral Pinnacle Marathon polyethylene insert, and a 36 mm M-SPEC +5 mm hip ball  Discharge Exam: Blood pressure 101/68, pulse (!) 111, temperature 98.8 F (37.1 C), temperature source Temporal, resp. rate 16, height 5\' 9"  (1.753 m), weight 100.2 kg, SpO2 96 %.   Disposition: Discharge disposition: 01-Home or Self Care        Allergies as of 12/24/2022   No Known Allergies      Medication List     TAKE these medications    acetaminophen 500 MG tablet Commonly known as: TYLENOL Take 1,000 mg by mouth every 6 (six) hours as needed for moderate pain.   B-complex with vitamin C tablet Take 1 tablet by mouth daily.   baclofen 10 MG tablet Commonly known as: LIORESAL Take by mouth.   BENADRYL PO Take 1 capsule  by mouth at bedtime as needed.   celecoxib 200 MG capsule Commonly known as: CELEBREX Take 1 capsule (200 mg total) by mouth 2 (two) times daily. What changed:  medication strength See the new instructions.   cholecalciferol 25 MCG (1000 UNIT) tablet Commonly known as: VITAMIN D3 Take 1,000 Units by mouth daily.   cyanocobalamin 1000 MCG tablet Commonly known as: VITAMIN B12 Take 1,000 mcg by mouth daily.   cyclobenzaprine 10 MG tablet Commonly known as: FLEXERIL Take 10 mg by mouth 3 (three) times daily as needed for muscle spasms.   DULoxetine 60 MG capsule Commonly known as: CYMBALTA TAKE 1 CAPSULE(60 MG) BY MOUTH DAILY   enoxaparin 40 MG/0.4ML injection Commonly known as: LOVENOX Inject 0.4 mLs (40 mg total) into the skin daily for 14 doses.   famotidine 20 MG tablet Commonly known as: PEPCID Take 20 mg by mouth 2 (two) times daily.   MAGNESIUM OXIDE PO Take 1 tablet by mouth daily as needed.   oxyCODONE 5 MG immediate release tablet Commonly known as: Oxy IR/ROXICODONE Take 1 tablet (5 mg total) by mouth every 4 (four) hours as needed for moderate pain (pain score 4-6).   traMADol 50 MG tablet Commonly known as: ULTRAM Take 1-2 tablets (50-100 mg total) by mouth every 4 (four) hours as needed for moderate pain.   Zinc 50 MG Tabs Take by mouth.  Durable Medical Equipment  (From admission, onward)           Start     Ordered   12/23/22 1057  DME Walker rolling  Once       Question:  Patient needs a walker to treat with the following condition  Answer:  S/P total hip arthroplasty   12/23/22 1056   12/23/22 1057  DME Bedside commode  Once       Comments: Patient is not able to walk the distance required to go the bathroom, or he/she is unable to safely negotiate stairs required to access the bathroom.  A 3in1 BSC will alleviate this problem  Question:  Patient needs a bedside commode to treat with the following condition  Answer:  S/P  total hip arthroplasty   12/23/22 1056            Follow-up Information     Hooten, Laurice Record, MD Follow up on 02/04/2023.   Specialty: Orthopedic Surgery Why: at 2:15PM Contact information: [address] [facility] [place] [address] [phone]                 Signed: Prescott Parma, Ayse Mccartin 12/24/2022, 7:19 AM   Objective: Vital signs in last 24 hours: Temp:  [97.5 F (36.4 C)-98.8 F (37.1 C)] 98.8 F (37.1 C) (01/23 0352) Pulse Rate:  [103-113] 111 (01/23 0352) Resp:  [11-18] 16 (01/23 0352) BP: (101-127)/(66-87) 101/68 (01/23 0352) SpO2:  [96 %-100 %] 96 % (01/23 0352)  Intake/Output from previous day:  Intake/Output Summary (Last 24 hours) at 12/24/2022 0719 Last data filed at 12/24/2022 0400 Gross per 24 hour  Intake 4192.49 ml  Output 3095 ml  Net 1097.49 ml    Intake/Output this shift: No intake/output data recorded.  Labs: No results for input(s): "HGB" in the last 72 hours. No results for input(s): "WBC", "RBC", "HCT", "PLT" in the last 72 hours. No results for input(s): "NA", "K", "CL", "CO2", "BUN", "CREATININE", "GLUCOSE", "CALCIUM" in the last 72 hours. No results for input(s): "LABPT", "INR" in the last 72 hours.  EXAM: General - Patient is Alert and Oriented Extremity - Neurovascular intact Sensation intact distally Dorsiflexion/Plantar flexion intact Compartment soft Incision - clean, dry, with the Hemovac tubing removed with no complication.  The Hemovac tubing was intact on removal. Motor Function -plantarflexion and dorsiflexion are intact.  Assessment/Plan: 1 Day Post-Op Procedure(s) (LRB): TOTAL HIP ARTHROPLASTY (Left) Procedure(s) (LRB): TOTAL HIP ARTHROPLASTY (Left) Past Medical History:  Diagnosis Date   Arthritis    GERD (gastroesophageal reflux disease)    Principal Problem:   Hx of total hip arthroplasty, left  Estimated body mass index is 32.62 kg/m as calculated from the following:   Height as of  this encounter: 5\' 9"  (1.753 m).   Weight as of this encounter: 100.2 kg. Advance diet Up with therapy D/C IV fluids Discharge home with home health Diet - Regular diet Follow up - in 6 weeks Activity - WBAT Disposition - Home Condition Upon Discharge - Stable DVT Prophylaxis - Lovenox and TED hose  Reche Dixon, PA-C Orthopaedic Surgery 12/24/2022, 7:19 AM

## 2022-12-24 NOTE — Evaluation (Signed)
Occupational Therapy Evaluation Patient Details Name: Courtney Cervantes MRN: [id number] DOB: 09/20/1956 Today's Date: 12/24/2022   History of Present Illness Pt is a 67 yo F diagnosed with degenerative arthrosis of the left hip and is s/p elective L THA.  PMH includes osteoarthritis and GERD.   Clinical Impression   Patient presenting with decreased independence in self care, balance, functional mobility/transfers, and endurance. PTA pt lived with SIL, was independent for ADLs/IADLs, and independent for functional mobility without an AD. Pt was educated on LLE WBAT, posterior hip precautions, and hip kit including LB dressing AE. Demonstration was provided for use of reacher and sock aid. Pt verbalized understanding. Pt deferred completing LB dressing this date using AE despite encouragement stating, "I feel good about using it." Pt will benefit from acute OT to increase overall independence in the areas of ADLs and functional mobility in order to safely discharge home. Pt could benefit from [facility] following D/C to decrease falls risk, improve balance, and maximize independence in self-care within own home environment.    Recommendations for follow up therapy are one component of a multi-disciplinary discharge planning process, led by the attending physician.  Recommendations may be updated based on patient status, additional functional criteria and insurance authorization.   Follow Up Recommendations  Home health OT     Assistance Recommended at Discharge Intermittent Supervision/Assistance  Patient can return home with the following A little help with walking and/or transfers;A little help with bathing/dressing/bathroom;Assistance with cooking/housework;Assist for transportation;Help with stairs or ramp for entrance    Functional Status Assessment  Patient has had a recent decline in their functional status and demonstrates the ability to make significant improvements in function in a  reasonable and predictable amount of time.  Equipment Recommendations  BSC/3in1;Other (comment) (hip kit)    Recommendations for Other Services       Precautions / Restrictions Precautions Precautions: Fall;Posterior Hip Restrictions Weight Bearing Restrictions: Yes LLE Weight Bearing: Weight bearing as tolerated      Mobility Bed Mobility               General bed mobility comments: pt deferred    Transfers                          Balance       Sitting balance - Comments: pt deferred                                   ADL either performed or assessed with clinical judgement   ADL Overall ADL's : Needs assistance/impaired                                       General ADL Comments: Pt deferred attempting LB dressing with use of AE, only wanted OT to demonstrate. Deferred grooming/toileting tasks as well, reported just using the [facility].     Vision Patient Visual Report: No change from baseline       Perception     Praxis      Pertinent Vitals/Pain Pain Assessment Pain Assessment: No/denies pain     Hand Dominance     Extremity/Trunk Assessment Upper Extremity Assessment Upper Extremity Assessment: Overall WFL for tasks assessed   Lower Extremity Assessment Lower Extremity Assessment: LLE deficits/detail LLE Deficits / Details: s/p THR  Communication Communication Communication: No difficulties   Cognition Arousal/Alertness: Awake/alert Behavior During Therapy: WFL for tasks assessed/performed Overall Cognitive Status: Within Functional Limits for tasks assessed                                       General Comments       Exercises Other Exercises Other Exercises: OT provided education re: role of OT, OT POC, post acute recs, sitting up for all meals, EOB/OOB mobility with assistance, home/fall safety, LLE WBAT, hip kit and LB dressing AE, posterior hip precautions (handout  provided), use of BSC for toileting   Shoulder Instructions      Home Living Family/patient expects to be discharged to:: Private residence Living Arrangements: Other relatives (SIL) Available Help at Discharge: Family;Friend(s);Available 24 hours/day Type of Home: House Home Access: Stairs to enter [employer] of Steps: 4 Entrance Stairs-Rails: Right;Left;Can reach both Home Layout: Two level;Able to live on main level with bedroom/bathroom     Bathroom Shower/Tub: Occupational psychologist: Standard     Home Equipment: Conservation officer, nature (2 wheels);Shower seat - built in          Prior Functioning/Environment Prior Level of Function : Independent/Modified Independent;Driving             Mobility Comments: Ind amb community distances without an AD, no fall history ADLs Comments: Ind with ADLs        OT Problem List: Decreased strength;Decreased activity tolerance;Impaired balance (sitting and/or standing);Decreased knowledge of use of DME or AE;Decreased knowledge of precautions;Pain      OT Treatment/Interventions: Self-care/ADL training;Therapeutic exercise;Therapeutic activities;Energy conservation;DME and/or AE instruction;Patient/family education;Balance training    OT Goals(Current goals can be found in the care plan section) Acute Rehab OT Goals Patient Stated Goal: return home OT Goal Formulation: With patient Time For Goal Achievement: 01/07/23 Potential to Achieve Goals: Good   OT Frequency: Min 2X/week    Co-evaluation              AM-PAC OT "6 Clicks" Daily Activity     Outcome Measure Help from another person eating meals?: None Help from another person taking care of personal grooming?: A Little Help from another person toileting, which includes using toliet, bedpan, or urinal?: A Little Help from another person bathing (including washing, rinsing, drying)?: A Little Help from another person to put on and taking off regular  upper body clothing?: None Help from another person to put on and taking off regular lower body clothing?: A Little 6 Click Score: 20   End of Session Nurse Communication: Mobility status  Activity Tolerance: Patient tolerated treatment well Patient left: in bed;with call bell/phone within reach;with bed alarm set  OT Visit Diagnosis: Unsteadiness on feet (R26.81);Muscle weakness (generalized) (M62.81)                Time: 2130-8657 OT Time Calculation (min): 18 min Charges:  OT General Charges $OT Visit: 1 Visit OT Evaluation $OT Eval Low Complexity: 1 Low  [facility] MS, OTR/L ascom [phone]  12/24/22, 1:22 PM

## 2022-12-25 ENCOUNTER — Telehealth: Payer: Self-pay

## 2022-12-25 NOTE — Telephone Encounter (Signed)
Transition Care Management Unsuccessful Follow-up Telephone Call  Date of discharge and from where:  TCM DC [facility] 12-24-22 Dx: Left total hip arthroplasty  Attempts:  1st Attempt  Reason for unsuccessful TCM follow-up call:  Unable to leave message   [place] Direct Dial [phone]

## 2022-12-26 NOTE — Telephone Encounter (Signed)
Transition Care Management Follow-up Telephone Call Date of discharge and from where:   TCM DC [facility] 12-24-22 Dx: Left total hip arthroplasty   How have you been since you were released from the hospital? Doing ok  Any questions or concerns? No  Items Reviewed: Did the pt receive and understand the discharge instructions provided? Yes  Medications obtained and verified? Yes  Other? No  Any new allergies since your discharge? No  Dietary orders reviewed? Yes Do you have support at home? Yes   Home Care and Equipment/Supplies: Were home health services ordered? no If so, what is the name of the agency? na  Has the agency set up a time to come to the patient's home? not applicable Were any new equipment or medical supplies ordered?  Yes: BSC/RW What is the name of the medical supply agency? hospital Were you able to get the supplies/equipment? yes Do you have any questions related to the use of the equipment or supplies? No  Functional Questionnaire: (I = Independent and D = Dependent) ADLs: I  Bathing/Dressing- I  Meal Prep- I  Eating- I  Maintaining continence- I  Transferring/Ambulation- I  Managing Meds- I  Follow up appointments reviewed:  PCP Hospital f/u appt confirmed? No  . [facility] f/u appt confirmed? Yes  Scheduled to see Dr Marry Guan on 02-04-23 @ 215pm. Are transportation arrangements needed? No  If their condition worsens, is the pt aware to call PCP or go to the Emergency Dept.? Yes Was the patient provided with contact information for the PCP's office or ED? Yes Was to pt encouraged to call back with questions or concerns? Yes   Juanda Crumble LPN [place] Direct Dial [phone]

## 2023-03-17 ENCOUNTER — Other Ambulatory Visit: Payer: Self-pay | Admitting: Family Medicine

## 2023-03-18 NOTE — Telephone Encounter (Signed)
Requested medication (s) are due for refill today - expired Rx  Requested medication (s) are on the active medication list -yes  Future visit scheduled -no  Last refill: 12/23/21- listed as historical  Notes to clinic: expired Rx  Requested Prescriptions  Pending Prescriptions Disp Refills   baclofen (LIORESAL) 10 MG tablet [Pharmacy Med Name: BACLOFEN  TABLETS] 270 tablet     Sig: TAKE 1/2 TO 1 TABLET(5 TO 10 MG) BY MOUTH THREE TIMES DAILY AS NEEDED FOR MUSCLE SPASMS     Analgesics:  Muscle Relaxants - baclofen Failed - 03/17/2023  1:23 PM      Failed - Valid encounter within last 6 months    Recent Outpatient Visits           6 months ago Primary osteoarthritis of left hip   [facility] [facility] Smitty Cords, DO   1 year ago Primary osteoarthritis of left hip   [place] [facility], Ocie Bob, MD   1 year ago Left lower quadrant abdominal pain   [place] [facility] Smitty Cords, DO   1 year ago Primary osteoarthritis of left hip   [place] [facility], Ocie Bob, MD   1 year ago Primary osteoarthritis of left hip   [place] [facility], Ocie Bob, MD              Passed - Cr in normal range and within 180 days    Creat  Date Value Ref Range Status  07/19/2021 0.88 0.50 - 1.05 mg/dL Final   Creatinine, Ser  Date Value Ref Range Status  12/10/2022 0.96 0.44 - 1.00 mg/dL Final         Passed - eGFR is 30 or above and within 180 days    GFR, Estimated  Date Value Ref Range Status  12/10/2022 >60 >60 mL/min Final    Comment:    (NOTE) Calculated using the CKD-EPI Creatinine Equation (2021)    eGFR  Date Value Ref Range Status  07/19/2021 72 > OR = 60 mL/min/1.87m2 Final    Comment:    The eGFR is based on the CKD-EPI 2021 equation. To calculate  the  new eGFR from a previous Creatinine or Cystatin C result, go to https://www.[email or web address].org/professionals/ kdoqi/gfr%[email or web address]             Requested Prescriptions  Pending Prescriptions Disp Refills   baclofen (LIORESAL) 10 MG tablet [Pharmacy Med Name: BACLOFEN  TABLETS] 270 tablet     Sig: TAKE 1/2 TO 1 TABLET(5 TO 10 MG) BY MOUTH THREE TIMES DAILY AS NEEDED FOR MUSCLE SPASMS     Analgesics:  Muscle Relaxants - baclofen Failed - 03/17/2023  1:23 PM      Failed - Valid encounter within last 6 months    Recent Outpatient Visits           6 months ago Primary osteoarthritis of left hip   [place] [facility] Smitty Cords, DO   1 year ago Primary osteoarthritis of left hip   [place] [facility], Ocie Bob, MD   1 year ago Left lower quadrant abdominal pain   [place] [facility] Smitty Cords, DO   1 year ago Primary osteoarthritis of left hip   [facility], Ocie Bob, MD   1 year ago Primary osteoarthritis of left hip   [place] [facility] at [facility], Ocie Bob, MD              Passed - Cr in normal range and within 180 days    Creat  Date Value Ref Range Status  07/19/2021 0.88 0.50 - 1.05 mg/dL Final   Creatinine, Ser  Date Value Ref Range Status  12/10/2022 0.96 0.44 - 1.00 mg/dL Final         Passed - eGFR is 30 or above and within 180 days    GFR, Estimated  Date Value Ref Range Status  12/10/2022 >60 >60 mL/min Final    Comment:    (NOTE) Calculated using the CKD-EPI Creatinine Equation (2021)    eGFR  Date Value Ref Range Status  07/19/2021 72 > OR = 60 mL/min/1.22m2 Final    Comment:    The eGFR is based on the CKD-EPI 2021 equation. To calculate  the new eGFR from a previous Creatinine or Cystatin C result, go to  https://www.kidney.org/professionals/ kdoqi/gfr%[email or web address]
